# Patient Record
Sex: Male | Born: 1960
Health system: Southern US, Community
[De-identification: ages and names within clinical notes are randomized; demographics above are authoritative.]

## PROBLEM LIST (undated history)

## (undated) DIAGNOSIS — R040 Epistaxis: Secondary | ICD-10-CM

## (undated) DIAGNOSIS — R7303 Prediabetes: Secondary | ICD-10-CM

## (undated) DIAGNOSIS — L309 Dermatitis, unspecified: Secondary | ICD-10-CM

## (undated) DIAGNOSIS — I10 Essential (primary) hypertension: Secondary | ICD-10-CM

## (undated) HISTORY — DX: Essential (primary) hypertension: I10

## (undated) HISTORY — DX: Prediabetes: R73.03

## (undated) HISTORY — DX: Dermatitis, unspecified: L30.9

## (undated) HISTORY — DX: Epistaxis: R04.0

---

## 2016-09-28 ENCOUNTER — Encounter: Payer: Self-pay | Admitting: Primary Care

## 2016-09-28 ENCOUNTER — Ambulatory Visit (INDEPENDENT_AMBULATORY_CARE_PROVIDER_SITE_OTHER): Payer: Commercial Managed Care - PPO | Admitting: Primary Care

## 2016-09-28 VITALS — BP 122/82 | HR 69 | Temp 97.9°F | Ht 70.0 in | Wt 267.1 lb

## 2016-09-28 DIAGNOSIS — R21 Rash and other nonspecific skin eruption: Secondary | ICD-10-CM | POA: Diagnosis not present

## 2016-09-28 DIAGNOSIS — I10 Essential (primary) hypertension: Secondary | ICD-10-CM | POA: Insufficient documentation

## 2016-09-28 DIAGNOSIS — L309 Dermatitis, unspecified: Secondary | ICD-10-CM | POA: Diagnosis not present

## 2016-09-28 DIAGNOSIS — R040 Epistaxis: Secondary | ICD-10-CM

## 2016-09-28 DIAGNOSIS — L400 Psoriasis vulgaris: Secondary | ICD-10-CM | POA: Insufficient documentation

## 2016-09-28 DIAGNOSIS — M7051 Other bursitis of knee, right knee: Secondary | ICD-10-CM | POA: Diagnosis not present

## 2016-09-28 MED ORDER — CLOTRIMAZOLE-BETAMETHASONE 1-0.05 % EX CREA: 1.0000 "application " | TOPICAL_CREAM | Freq: Two times a day (BID) | CUTANEOUS | 3 refills | Status: DC

## 2016-09-28 MED ORDER — PREDNISONE 10 MG PO TABS: ORAL_TABLET | ORAL | 0 refills | Status: DC

## 2016-09-28 NOTE — Progress Notes (Signed)
Pre visit review using our clinic review tool, if applicable. No additional management support is needed unless otherwise documented below in the visit note. 

## 2016-09-28 NOTE — Patient Instructions (Signed)
You rash represents dyshidrotic eczema.  Continue your Lotrisone cream, apply it twice daily.  Start prednisone tablets. Take three tablets for 3 days, then two tablets for 3 days, then one tablet for 3 days.   You will be contacted regarding your referral to Dermatology.  Please let us know if you have not heard back within one week.   Wear a knee brace to reduce swelling. Continue to elevate your legs.  Please notify me if no improvement in knee swelling.  Please schedule a physical with me in 2018. You may also schedule a lab only appointment 3-4 days prior. We will discuss your lab results in detail during your physical.  It was a pleasure to meet you today! Please don't hesitate to call me with any questions. Welcome to [facility]!

## 2016-09-28 NOTE — Assessment & Plan Note (Signed)
Suspect due to changes in temperature, dry climate. Will have him try saline nasal spray, Vaseline to nares, humidifier. If no improvement with changes in season, consider ENT referral.

## 2016-09-28 NOTE — Assessment & Plan Note (Signed)
Managed on Norvasc and Hyzaar, stable today, continue current regimen. Check BMP at upcoming physical.

## 2016-09-28 NOTE — Assessment & Plan Note (Signed)
Located to bilateral palmar hands. Somewhat represents dyshidrotic eczema. Will treat with oral and topical steroids. Dermatology referral placed if no improvement.

## 2016-09-28 NOTE — Progress Notes (Signed)
Subjective:    Patient ID: Joseph Howe, male    DOB: May 14, 1961, 56 y.o.   MRN: [id number]  HPI  Joseph Howe is a 56 year old male who presents today to establish care and discuss the problems mentioned below. Will obtain old records. His last physical was 2 years ago.  1) Epistaxis: Occur in the morning, located to the left nares only. Occur every 2-3 days on average, sometimes several days in a row. His house is dry and is working on getting a humidifier. The bleeding will stop after 1-2 minutes after application of pressure, and bleeding is minimal. He does not take any antihistamines OTC. He's not tried anything OTC for nosebleeds. He does have a history of nose bleeds that occurred years ago secondary to a dry environment. Not managed on blood thinners.  2) Eczema: Managed on clotrimazole-betamethasone cream with improvement. Mostly to feet that has been present for the past 20 years.   He has noticed blistering to his hands that will dry out. This has been present for the past 1 month. Overall he's noticed temporary improvement with OTC cortisone-10 mg. His hands have been dry for the past 1 month. He doesn't work with his hands often. No one else in his household has these symptoms. He denies changes to detergents, soaps, medications, foods. He denies pain.  3) Essential Hypertension: Diagnosed 15 years ago. Currently managed on Amlodipine 10 mg, losartan-HCTZ 100/25 mg. His BP today is 122/82. He endorses a fair diet and does not regularly exercise. He denies chest pain, cough, dizziness.  4) Knee Inflammation: Located to the right knee for the past 2 months. He thinks he may have injured his knee while moving 2 months ago as he remembers bumping into objects. Improvement with Aleve. He once had bursitis with enough fluid accumulation that had to be aspirated. He denies pain.  Review of Systems  Constitutional: Negative for fever.  HENT: Positive for nosebleeds.   Eyes: Negative for  visual disturbance.  Respiratory: Negative for cough.   Cardiovascular: Negative for chest pain.  Musculoskeletal: Positive for joint swelling.  Skin: Positive for rash.  Allergic/Immunologic: Negative for environmental allergies.  Neurological: Negative for dizziness.  Hematological: Negative for adenopathy.       No past medical history on file.   Social History   Social History  . Marital status: Married    Spouse name: N/A  . Number of children: N/A  . Years of education: N/A   Occupational History  . Not on file.   Social History Main Topics  . Smoking status: Never Smoker  . Smokeless tobacco: Never Used  . Alcohol use Yes  . Drug use: Unknown  . Sexual activity: Not on file   Other Topics Concern  . Not on file   Social History Narrative  . No narrative on file    No past surgical history on file.  No family history on file.  No Known Allergies  No current outpatient prescriptions on file prior to visit.   No current facility-administered medications on file prior to visit.     BP 122/82   Pulse 69   Temp 97.9 F (36.6 C) (Oral)   Ht 5\' 10"  (1.778 m)   Wt 267 lb 1.9 oz (121.2 kg)   SpO2 97%   BMI 38.33 kg/m    Objective:   Physical Exam  Constitutional: He is oriented to person, place, and time. He appears well-nourished.  Neck: Neck supple.  Cardiovascular:  Normal rate and regular rhythm.   Pulmonary/Chest: Effort normal and breath sounds normal. He has no wheezes. He has no rales.  Musculoskeletal: Normal range of motion.  Very minimal amount of swelling to right anterior patella. Good ROM. No pain.  Neurological: He is alert and oriented to person, place, and time.  Skin: Skin is warm and dry. No erythema.  Dried blisters to bilateral palmar hands. Dry skin to hands.  Psychiatric: He has a normal mood and affect.          Assessment & Plan:

## 2016-09-28 NOTE — Assessment & Plan Note (Signed)
Chronic to feet. Refill provided for lotrisone cream.

## 2016-09-28 NOTE — Assessment & Plan Note (Signed)
Very mild.  Will trial oral steroids, hold NSAIDS. Discussed use of a knee brace, elevated legs at night. Weight loss. Follow up PRN.

## 2017-01-20 ENCOUNTER — Other Ambulatory Visit: Payer: Self-pay | Admitting: Primary Care

## 2017-01-20 DIAGNOSIS — I10 Essential (primary) hypertension: Secondary | ICD-10-CM

## 2017-01-20 DIAGNOSIS — Z125 Encounter for screening for malignant neoplasm of prostate: Secondary | ICD-10-CM

## 2017-01-23 ENCOUNTER — Other Ambulatory Visit (INDEPENDENT_AMBULATORY_CARE_PROVIDER_SITE_OTHER): Payer: Commercial Managed Care - PPO

## 2017-01-23 DIAGNOSIS — I10 Essential (primary) hypertension: Secondary | ICD-10-CM

## 2017-01-23 DIAGNOSIS — Z125 Encounter for screening for malignant neoplasm of prostate: Secondary | ICD-10-CM

## 2017-01-23 LAB — COMPREHENSIVE METABOLIC PANEL
ALT: 41 U/L (ref 0–53)
AST: 28 U/L (ref 0–37)
Albumin: 4.3 g/dL (ref 3.5–5.2)
Alkaline Phosphatase: 39 U/L (ref 39–117)
BILIRUBIN TOTAL: 0.5 mg/dL (ref 0.2–1.2)
BUN: 10 mg/dL (ref 6–23)
CO2: 30 meq/L (ref 19–32)
Calcium: 9.6 mg/dL (ref 8.4–10.5)
Chloride: 100 mEq/L (ref 96–112)
Creatinine, Ser: 0.63 mg/dL (ref 0.40–1.50)
GFR: 140.97 mL/min (ref >60.00)
GLUCOSE: 115 mg/dL — AB (ref 70–99)
Potassium: 4.2 mEq/L (ref 3.5–5.1)
SODIUM: 137 meq/L (ref 135–145)
Total Protein: 7 g/dL (ref 6.0–8.3)

## 2017-01-23 LAB — LIPID PANEL
CHOL/HDL RATIO: 3
Cholesterol: 180 mg/dL (ref 0–200)
HDL: 61.1 mg/dL (ref >39.00)
LDL CALC: 106 mg/dL — AB (ref 0–99)
NONHDL: 118.94
Triglycerides: 65 mg/dL (ref 0.0–149.0)
VLDL: 13 mg/dL (ref 0.0–40.0)

## 2017-01-23 LAB — PSA: PSA: 2.64 ng/mL (ref 0.10–4.00)

## 2017-01-27 ENCOUNTER — Encounter: Payer: Self-pay | Admitting: Primary Care

## 2017-01-27 ENCOUNTER — Ambulatory Visit (INDEPENDENT_AMBULATORY_CARE_PROVIDER_SITE_OTHER): Payer: Commercial Managed Care - PPO | Admitting: Primary Care

## 2017-01-27 ENCOUNTER — Other Ambulatory Visit: Payer: Self-pay | Admitting: Primary Care

## 2017-01-27 VITALS — BP 144/94 | HR 71 | Temp 98.2°F | Ht 70.0 in | Wt 256.8 lb

## 2017-01-27 DIAGNOSIS — I10 Essential (primary) hypertension: Secondary | ICD-10-CM | POA: Diagnosis not present

## 2017-01-27 DIAGNOSIS — R739 Hyperglycemia, unspecified: Secondary | ICD-10-CM | POA: Diagnosis not present

## 2017-01-27 DIAGNOSIS — R7303 Prediabetes: Secondary | ICD-10-CM | POA: Insufficient documentation

## 2017-01-27 DIAGNOSIS — L309 Dermatitis, unspecified: Secondary | ICD-10-CM | POA: Diagnosis not present

## 2017-01-27 DIAGNOSIS — E119 Type 2 diabetes mellitus without complications: Secondary | ICD-10-CM | POA: Insufficient documentation

## 2017-01-27 DIAGNOSIS — Z23 Encounter for immunization: Secondary | ICD-10-CM | POA: Diagnosis not present

## 2017-01-27 DIAGNOSIS — Z0000 Encounter for general adult medical examination without abnormal findings: Secondary | ICD-10-CM

## 2017-01-27 DIAGNOSIS — Z0001 Encounter for general adult medical examination with abnormal findings: Secondary | ICD-10-CM | POA: Insufficient documentation

## 2017-01-27 LAB — HEMOGLOBIN A1C: HEMOGLOBIN A1C: 6.1 % (ref 4.6–6.5)

## 2017-01-27 MED ORDER — CLOTRIMAZOLE-BETAMETHASONE 1-0.05 % EX CREA: 1.0000 "application " | TOPICAL_CREAM | Freq: Two times a day (BID) | CUTANEOUS | 3 refills | Status: DC

## 2017-01-27 MED ORDER — PREDNISONE 10 MG PO TABS: ORAL_TABLET | ORAL | 0 refills | Status: DC

## 2017-01-27 NOTE — Assessment & Plan Note (Signed)
Chronic to hands and feet. Refill provided for Lotrisone cream. Low-dose prednisone course sent to pharmacy for mild outbreak to right palm.

## 2017-01-27 NOTE — Addendum Note (Signed)
Addended by: Jacqualin Combes on: 01/27/2017 03:02 PM   Modules accepted: Orders

## 2017-01-27 NOTE — Assessment & Plan Note (Addendum)
TD due, provided today. PSA normal. Discussed the importance of a healthy diet and regular exercise in order for weight loss, and to reduce the risk of other medical problems. Exam today unremarkable, except for chronic eczema. Labs with hyperglycemia, A1c pending. Otherwise unremarkable. Follow-up in one year for annual exam.

## 2017-01-27 NOTE — Progress Notes (Signed)
Subjective:    Patient ID: Joseph Howe, male    DOB: Sep 15, 1960, 56 y.o.   MRN: [id number]  HPI  Mr. Joseph Howe is a 56 year old male who presents today for complete physical.  1) Eczema: Located to hands and feet for years. The prednisone course last visit helped to improve the rash to his right palm, but no resolve. He would like another course of this to hopefully eradicate the eczema.   The Lotrisone cream is helpful. He has followed with both podiatry and dermatology in the past. This is the best regimen for him.  Immunizations: -Tetanus: Completed over 10 years ago. -Influenza: Did not complete last season   Diet: He endorses a high protein, low carb diet that he began in February 2018. Breakfast: Atkins protein bar Lunch: Protein shake Dinner: Meats, salads Snacks: Nuts, meat/cheese Desserts: None, sometimes low carb ice cream Beverages: Water, coffee, three drinks four days weekly of Vodka.   Exercise: He does not currently exercise Eye exam: Completed three years ago. Dental exam: Completes annually Colonoscopy: Completed at age 19, due at age 33. Due in 2019. PSA: Normal today.  Wt Readings from Last 3 Encounters:  01/27/17 256 lb 12.8 oz (116.5 kg)  09/28/16 267 lb 1.9 oz (121.2 kg)      Review of Systems  Constitutional: Negative for unexpected weight change.  HENT: Negative for rhinorrhea.   Respiratory: Negative for cough and shortness of breath.   Cardiovascular: Negative for chest pain.  Gastrointestinal: Negative for constipation and diarrhea.  Genitourinary: Negative for difficulty urinating.  Musculoskeletal: Positive for arthralgias. Negative for myalgias.  Skin: Positive for rash.  Allergic/Immunologic: Negative for environmental allergies.  Neurological: Negative for dizziness, numbness and headaches.  Psychiatric/Behavioral:       Denies concerns for anxiety or depression       No past medical history on file.   Social History   Social  History  . Marital status: Married    Spouse name: N/A  . Number of children: N/A  . Years of education: N/A   Occupational History  . Not on file.   Social History Main Topics  . Smoking status: Never Smoker  . Smokeless tobacco: Never Used  . Alcohol use Yes  . Drug use: Unknown  . Sexual activity: Not on file   Other Topics Concern  . Not on file   Social History Narrative  . No narrative on file    No past surgical history on file.  No family history on file.  No Known Allergies  Current Outpatient Prescriptions on File Prior to Visit  Medication Sig Dispense Refill  . amLODipine (NORVASC) 10 MG tablet Take 10 mg by mouth daily.     Marland Kitchen losartan-hydrochlorothiazide (HYZAAR) 100-25 MG tablet Take 1 tablet by mouth daily.      No current facility-administered medications on file prior to visit.     BP (!) 144/94   Pulse 71   Temp 98.2 F (36.8 C) (Oral)   Ht 5\' 10"  (1.778 m)   Wt 256 lb 12.8 oz (116.5 kg)   SpO2 96%   BMI 36.85 kg/m    Objective:   Physical Exam  Constitutional: He is oriented to person, place, and time. He appears well-nourished.  HENT:  Right Ear: Tympanic membrane and ear canal normal.  Left Ear: Tympanic membrane and ear canal normal.  Nose: Nose normal. Right sinus exhibits no maxillary sinus tenderness and no frontal sinus tenderness. Left sinus exhibits no  maxillary sinus tenderness and no frontal sinus tenderness.  Mouth/Throat: Oropharynx is clear and moist.  Eyes: Conjunctivae and EOM are normal. Pupils are equal, round, and reactive to light.  Neck: Neck supple. Carotid bruit is not present. No thyromegaly present.  Cardiovascular: Normal rate, regular rhythm and normal heart sounds.   Pulmonary/Chest: Effort normal and breath sounds normal. He has no wheezes. He has no rales.  Abdominal: Soft. Bowel sounds are normal. There is no tenderness.  Musculoskeletal: Normal range of motion.  Neurological: He is alert and oriented to  person, place, and time. He has normal reflexes. No cranial nerve deficit.  Skin: Skin is warm and dry.  Dry, scaly rash to right palm and right dorsal MCP joint of second digit. Scaly, dry skin to bilateral plantar feet.  Psychiatric: He has a normal mood and affect.          Assessment & Plan:

## 2017-01-27 NOTE — Assessment & Plan Note (Signed)
Above goal today, normal reading during last visit several months ago. Will have him start monitoring at home and report elevated readings at or above 135/90. BMP unremarkable. Continue amlodipine and Hyzaar.

## 2017-01-27 NOTE — Assessment & Plan Note (Signed)
Noted on fasting labs. Check A1c today.

## 2017-01-27 NOTE — Patient Instructions (Addendum)
Complete lab work prior to leaving today. I will notify you of your results once received.   Start exercising. You should be getting 150 minutes of moderate intensity exercise weekly.  Increase consumption of vegetables, fruit, whole grains, water.  Ensure you are consuming 64 ounces of water daily.  You were provided with a tetanus vaccination which will cover you for 10 years.  Start prednisone tablets. Take three tablets for 3 days, then two tablets for 3 days, then one tablet for 3 days.  Monitor your blood pressure and notify me if you see readings at or above 135/90 on a consistent basis.  Follow up in 1 year for your annual exam or sooner if needed.  It was a pleasure to see you today!

## 2017-02-01 ENCOUNTER — Telehealth: Payer: Self-pay | Admitting: Primary Care

## 2017-02-01 NOTE — Telephone Encounter (Signed)
Pt returned call

## 2017-02-01 NOTE — Telephone Encounter (Signed)
Pt returned your call about labs  Thanks  [phone]

## 2017-02-01 NOTE — Telephone Encounter (Signed)
Message left for patient to return my call.  

## 2017-02-02 NOTE — Telephone Encounter (Signed)
Spoken to patient and lab appt has been scheduled.

## 2017-03-20 ENCOUNTER — Encounter: Payer: Self-pay | Admitting: Primary Care

## 2017-03-20 ENCOUNTER — Other Ambulatory Visit: Payer: Self-pay | Admitting: Primary Care

## 2017-03-20 DIAGNOSIS — L309 Dermatitis, unspecified: Secondary | ICD-10-CM

## 2017-03-20 MED ORDER — PREDNISONE 10 MG PO TABS: ORAL_TABLET | ORAL | 0 refills | Status: DC

## 2017-04-10 ENCOUNTER — Ambulatory Visit (INDEPENDENT_AMBULATORY_CARE_PROVIDER_SITE_OTHER): Payer: Commercial Managed Care - PPO | Admitting: Primary Care

## 2017-04-10 ENCOUNTER — Encounter: Payer: Self-pay | Admitting: Primary Care

## 2017-04-10 ENCOUNTER — Ambulatory Visit: Payer: Commercial Managed Care - PPO | Admitting: Primary Care

## 2017-04-10 VITALS — BP 134/84 | HR 68 | Temp 98.4°F | Ht 70.0 in | Wt 253.4 lb

## 2017-04-10 DIAGNOSIS — R21 Rash and other nonspecific skin eruption: Secondary | ICD-10-CM

## 2017-04-10 NOTE — Patient Instructions (Signed)
Stop by the front desk and speak with either Rosaria Ferries or Shirlean Mylar regarding your referral to Dermatology.  It was a pleasure to see you today!

## 2017-04-10 NOTE — Assessment & Plan Note (Signed)
Temporary improvement with topical, oral, and patch treatment. Referral placed to dermatology for further evaluation.

## 2017-04-10 NOTE — Progress Notes (Signed)
   Subjective:    Patient ID: Joseph Howe, male    DOB: 1961-07-18, 56 y.o.   MRN: [id number]  HPI  Joseph Howe is a 56 year old male with a history of eczema and rash to bilateral hands and feet who presents today for referral.  He's experienced flaking, peeling, and dry skin to the plantar feet for the past 20+ years. He once saw a dermatologist years ago who thought this was psoriasis. He's currently using clotrimazole-betamethasone cream and Eucaderm hydrocolloid patches which provide temporary improvement for 3 months. He denies itching, erythema, pain. He would like to see a dermatologist again for further evaluation.  Review of Systems  Constitutional: Negative for fever.  Skin: Positive for rash. Negative for wound.  Neurological: Negative for numbness.       No past medical history on file.   Social History   Social History  . Marital status: Married    Spouse name: N/A  . Number of children: N/A  . Years of education: N/A   Occupational History  . Not on file.   Social History Main Topics  . Smoking status: Never Smoker  . Smokeless tobacco: Never Used  . Alcohol use Yes  . Drug use: Unknown  . Sexual activity: Not on file   Other Topics Concern  . Not on file   Social History Narrative  . No narrative on file    No past surgical history on file.  No family history on file.  Allergies  Allergen Reactions  . Pollen Extract     Current Outpatient Prescriptions on File Prior to Visit  Medication Sig Dispense Refill  . amLODipine (NORVASC) 10 MG tablet Take 10 mg by mouth daily.     . clotrimazole-betamethasone (LOTRISONE) cream Apply 1 application topically 2 (two) times daily. 45 g 3  . losartan-hydrochlorothiazide (HYZAAR) 100-25 MG tablet Take 1 tablet by mouth daily.     . predniSONE (DELTASONE) 10 MG tablet Take three tablets for 3 days, then two tablets for 3 days, then one tablet for 3 days. 18 tablet 0   No current facility-administered  medications on file prior to visit.     BP 134/84   Pulse 68   Temp 98.4 F (36.9 C) (Oral)   Ht 5\' 10"  (1.778 m)   Wt 253 lb 6.4 oz (114.9 kg)   SpO2 97%   BMI 36.36 kg/m    Objective:   Physical Exam  Constitutional: He appears well-nourished.  Neck: Neck supple.  Cardiovascular: Normal rate.   Pulmonary/Chest: Effort normal.  Skin: Skin is warm and dry.  Peeling skin to bilateral plantar feet. No erythema, rash, cracks to skin.          Assessment & Plan:

## 2017-04-11 ENCOUNTER — Ambulatory Visit: Payer: Commercial Managed Care - PPO | Admitting: Primary Care

## 2017-05-12 ENCOUNTER — Other Ambulatory Visit: Payer: Self-pay | Admitting: Primary Care

## 2017-07-27 ENCOUNTER — Other Ambulatory Visit: Payer: Self-pay | Admitting: Primary Care

## 2017-08-04 ENCOUNTER — Other Ambulatory Visit: Payer: Commercial Managed Care - PPO

## 2017-08-17 ENCOUNTER — Encounter: Payer: Self-pay | Admitting: Primary Care

## 2017-08-21 ENCOUNTER — Other Ambulatory Visit: Payer: Commercial Managed Care - PPO

## 2017-08-21 ENCOUNTER — Other Ambulatory Visit (INDEPENDENT_AMBULATORY_CARE_PROVIDER_SITE_OTHER): Payer: Commercial Managed Care - PPO

## 2017-08-21 DIAGNOSIS — R7303 Prediabetes: Secondary | ICD-10-CM | POA: Diagnosis not present

## 2017-08-21 LAB — HEMOGLOBIN A1C: Hgb A1c MFr Bld: 6 % (ref 4.6–6.5)

## 2017-08-24 ENCOUNTER — Ambulatory Visit: Payer: Commercial Managed Care - PPO | Admitting: Primary Care

## 2017-08-24 ENCOUNTER — Encounter: Payer: Self-pay | Admitting: Primary Care

## 2017-08-24 VITALS — BP 134/78 | HR 78 | Temp 98.2°F | Wt 251.5 lb

## 2017-08-24 DIAGNOSIS — M25569 Pain in unspecified knee: Secondary | ICD-10-CM

## 2017-08-24 DIAGNOSIS — R7303 Prediabetes: Secondary | ICD-10-CM

## 2017-08-24 DIAGNOSIS — Z720 Tobacco use: Secondary | ICD-10-CM | POA: Diagnosis not present

## 2017-08-24 DIAGNOSIS — G8929 Other chronic pain: Secondary | ICD-10-CM | POA: Diagnosis not present

## 2017-08-24 DIAGNOSIS — M25562 Pain in left knee: Secondary | ICD-10-CM | POA: Diagnosis not present

## 2017-08-24 MED ORDER — NICOTINE 21 MG/24HR TD PT24: 21.0000 mg | MEDICATED_PATCH | Freq: Every day | TRANSDERMAL | 1 refills | Status: DC

## 2017-08-24 NOTE — Assessment & Plan Note (Signed)
Repeat A1C about the same. Strongly encouraged him to limit alcohol use and to start exercising. Will repeat this Summer during CPE.

## 2017-08-24 NOTE — Patient Instructions (Addendum)
Start the nicotine patch for smoking cessation.  Apply one patch onto a hairless area of the skin once daily, rotate skin sites daily. Use the 21 mcg patch for a total of 6 weeks.   Please update me in 5-6 weeks so we can titrate you down to a decreased dose of 14 mcg.   Start exercising. You should be getting 150 minutes of moderate intensity exercise weekly.  Limit alcohol. Increase vegetables, fruit, whole grains.  Ensure you are consuming 64 ounces of water daily.  You are welcome to see Dr. Lorelei Pont, our Sports Medicine doctor.  Please schedule a physical with me in June 2019. You may also schedule a lab only appointment 3-4 days prior. We will discuss your lab results in detail during your physical.  It was a pleasure to see you today!

## 2017-08-24 NOTE — Progress Notes (Signed)
Subjective:    Patient ID: Joseph Howe, male    DOB: 1961/06/05, 57 y.o.   MRN: [id number]  HPI  Joseph Howe is a 57 year old male with a history of tobacco abuse who presents today wanting to discuss cessation from tobacco products and for follow up.   1) Tobacco abuse: He started smoking cigarettes one year ago. He is currently smoking less than one pack per day. He's smoked cigars once every 1-3 months for the past 10 years. He coughs every morning which will clear during the day. He denies wheezing or shortness of breath, hemoptysis, unexplained weight loss. He's not tried anything over the counter for his symptoms.   2) Prediabetes: Recent A1C of 6.0 on labs. A1C of 6.0 in June 2018. He's surprised by these labs as he endorses a healthy diet.   Diet currently consists of:  Breakfast: Protein bar Lunch: Sandwich, steak, eggs, protein shake Dinner: Meat, some vegetables, starch Snacks: None Desserts: Low carb ice cream three times weekly Beverages: Water, coffee, vodka and tonic water (3 drinks per night, 5 days weekly).  Exercise: He does not currently exercise  3) Chronic Knee Pain: Located to left knee for the past 10 years. He's wearing a  compression brace and taking tumeric with some improvement. History of torn ACL in college. He was once told that he is "bone-on-bone" to his left knee.    Review of Systems  Constitutional: Negative for unexpected weight change.  Respiratory: Negative for shortness of breath.   Cardiovascular: Negative for chest pain.  Musculoskeletal:       Chronic left knee pain       No past medical history on file.   Social History   Socioeconomic History  . Marital status: Married    Spouse name: Not on file  . Number of children: Not on file  . Years of education: Not on file  . Highest education level: Not on file  Social Needs  . Financial resource strain: Not on file  . Food insecurity - worry: Not on file  . Food insecurity -  inability: Not on file  . Transportation needs - medical: Not on file  . Transportation needs - non-medical: Not on file  Occupational History  . Not on file  Tobacco Use  . Smoking status: Current Some Day Smoker  . Smokeless tobacco: Never Used  Substance and Sexual Activity  . Alcohol use: Yes  . Drug use: Not on file  . Sexual activity: Not on file  Other Topics Concern  . Not on file  Social History Narrative  . Not on file    No family history on file.  Allergies  Allergen Reactions  . Pollen Extract     Current Outpatient Medications on File Prior to Visit  Medication Sig Dispense Refill  . amLODipine (NORVASC) 10 MG tablet TAKE 1 TABLET BY MOUTH EVERY DAY 90 tablet 1  . clotrimazole-betamethasone (LOTRISONE) cream Apply 1 application topically 2 (two) times daily. 45 g 3  . losartan-hydrochlorothiazide (HYZAAR) 100-25 MG tablet TAKE 1 TABLET BY MOUTH EVERY DAY 90 tablet 1   No current facility-administered medications on file prior to visit.     BP 134/78   Pulse 78   Temp 98.2 F (36.8 C) (Oral)   Wt 251 lb 8 oz (114.1 kg)   SpO2 97%   BMI 36.09 kg/m    Objective:   Physical Exam  Constitutional: He appears well-nourished.  Neck: Neck supple.  Cardiovascular: Normal rate and regular rhythm.  Pulmonary/Chest: Effort normal and breath sounds normal.  Skin: Skin is warm and dry.          Assessment & Plan:

## 2017-08-24 NOTE — Assessment & Plan Note (Signed)
Continue wearing brace.  Discussed different options, he will check in to seeing our sports medicine team for injections and/or other treatment.

## 2017-08-24 NOTE — Assessment & Plan Note (Signed)
Ready to quit. Discussed various options and recommended he start with patches. Rx for nicotine 21 mcg patches sent to pharmacy. Will have him start at 21 mcg x 6 weeks then titrate down. He will update if he encounters any problems.

## 2017-08-28 ENCOUNTER — Other Ambulatory Visit: Payer: Commercial Managed Care - PPO

## 2017-08-31 ENCOUNTER — Encounter: Payer: Self-pay | Admitting: Family Medicine

## 2017-08-31 ENCOUNTER — Ambulatory Visit (INDEPENDENT_AMBULATORY_CARE_PROVIDER_SITE_OTHER)
Admission: RE | Admit: 2017-08-31 | Discharge: 2017-08-31 | Disposition: A | Discharge status: Home / Self Care | Payer: Commercial Managed Care - PPO | Source: Ambulatory Visit | Attending: Family Medicine | Admitting: Family Medicine

## 2017-08-31 ENCOUNTER — Ambulatory Visit (INDEPENDENT_AMBULATORY_CARE_PROVIDER_SITE_OTHER): Payer: Commercial Managed Care - PPO | Admitting: Family Medicine

## 2017-08-31 ENCOUNTER — Other Ambulatory Visit: Payer: Self-pay

## 2017-08-31 VITALS — BP 128/80 | HR 74 | Temp 98.6°F | Ht 70.0 in | Wt 251.0 lb

## 2017-08-31 DIAGNOSIS — M1712 Unilateral primary osteoarthritis, left knee: Secondary | ICD-10-CM

## 2017-08-31 DIAGNOSIS — G8929 Other chronic pain: Secondary | ICD-10-CM

## 2017-08-31 DIAGNOSIS — M25562 Pain in left knee: Secondary | ICD-10-CM | POA: Diagnosis not present

## 2017-08-31 NOTE — Progress Notes (Signed)
Dr. Frederico Hamman T. Earlie Arciga, MD, [facility] [place], [address] Phone: [phone] Fax: [phone]  08/31/2017  Patient: Joseph Howe, MRN: [id number], DOB: 1960/08/17, 57 y.o.  Primary Physician:  Pleas Koch, NP   Chief Complaint  Patient presents with  . Knee Pain    Left   Subjective:   Myka Lukins is a 57 y.o. very pleasant male patient who presents with the following:  57 yo had a partial ACL tear and then when turned 42 had a lot of cartilage. Now has some more pain and decrease of rom with stairs. Has lost about 30 pounds.   The patient has been having problems with his knee for approximately 30 years.  Is unclear if he had a partial or a full tear of his ACL, but he does describe some mechanical instability and a pivot shift phenomenon that is been happening for decades when he is moving.  He does currently have a moderate effusion, but he has not had any recent trauma or injury to his knee.  He has not had any prior operative intervention to the knee.  Currently, he is not really having any significant pain, and his primary concern is range of motion and functionality long-term.  Past Medical History, Surgical History, Social History, Family History, Problem List, Medications, and Allergies have been reviewed and updated if relevant.  Patient Active Problem List   Diagnosis Date Noted  . Tobacco abuse 08/24/2017  . Chronic knee pain 08/24/2017  . Preventative health care 01/27/2017  . Prediabetes 01/27/2017  . Rash and nonspecific skin eruption 09/28/2016  . Eczema 09/28/2016  . Bursitis of right knee 09/28/2016  . Essential hypertension 09/28/2016  . Epistaxis 09/28/2016    History reviewed. No pertinent past medical history.  History reviewed. No pertinent surgical history.  Social History   Socioeconomic History  . Marital status: Married    Spouse name: Not on file  . Number of children:  Not on file  . Years of education: Not on file  . Highest education level: Not on file  Social Needs  . Financial resource strain: Not on file  . Food insecurity - worry: Not on file  . Food insecurity - inability: Not on file  . Transportation needs - medical: Not on file  . Transportation needs - non-medical: Not on file  Occupational History  . Not on file  Tobacco Use  . Smoking status: Current Some Day Smoker  . Smokeless tobacco: Never Used  Substance and Sexual Activity  . Alcohol use: Yes  . Drug use: Not on file  . Sexual activity: Not on file  Other Topics Concern  . Not on file  Social History Narrative  . Not on file    History reviewed. No pertinent family history.  Allergies  Allergen Reactions  . Pollen Extract     Medication list reviewed and updated in full in [facility].  GEN: No fevers, chills. Nontoxic. Primarily MSK c/o today. MSK: Detailed in the HPI GI: tolerating PO intake without difficulty Neuro: No numbness, parasthesias, or tingling associated. Otherwise the pertinent positives of the ROS are noted above.   Objective:   BP 128/80   Pulse 74   Temp 98.6 F (37 C) (Oral)   Ht 5\' 10"  (1.778 m)   Wt 251 lb (113.9 kg)   BMI 36.01 kg/m    GEN: WDWN, NAD, Non-toxic, Alert & Oriented x 3  HEENT: Atraumatic, Normocephalic.  Ears and Nose: No external deformity. EXTR: No clubbing/cyanosis/edema NEURO: Normal gait.  PSYCH: Normally interactive. Conversant. Not depressed or anxious appearing.  Calm demeanor.    Left knee: Lacks 2 degrees of extension.  There is a moderate effusion.  Flexion to 95 degrees.  He has tenderness on the medial and lateral joint lines.  No pain with loading the patellar facets.  Significant crepitus.  Stable MCL and LCL.  Anterior drawer appears to give greater give than anticipated.  Posterior drawer seems normal.  Significant guarding on L[place], but increased give compared to the contralateral side. mcmurrays  is grossly negative. Significant L quads wasting.  Radiology: Dg Knee 4 Views W/patella Left  Result Date: 08/31/2017 CLINICAL DATA:  Left knee discomfort EXAM: LEFT KNEE - COMPLETE 4+ VIEW COMPARISON:  None. FINDINGS: There is tricompartmental degenerative joint disease of the left knee. There is primary involvement of the medial compartment where there is almost complete loss of joint space with sclerosis and spurring present. No acute fracture is seen. There may be a small amount of knee joint fluid present. IMPRESSION: 1. Age advanced tricompartmental degenerative joint disease the left knee primarily involving the medial compartment. 2. Question small amount of left knee joint fluid. Electronically Signed   By: Ivar Drape M.D.   On: 08/31/2017 17:19     Assessment and Plan:   Chronic pain of left knee - Plan: DG Knee 4 Views W/Patella Left  Osteoarthrosis, localized, primary, knee, left  >25 minutes spent in face to face time with patient, >50% spent in counselling or coordination of care   My suspicion is that the patient has had an ACL deficient knee for decades.  His left knee is end-stage degenerative joint disease pathology, most notably medially, but his right knee looks remarkably well preserved.  Challenging case.  I doubt anything short of total joint arthroplasty would help the patient surgically.  He is going to work on some knee flexion exercise, hopefully to regain some range of motion, and begin cycling for exercise, and I encouraged him to work on his quadriceps strength.  Continue with tumeric, supplements, as well as Tylenol and NSAIDs as needed for intermittent pain.  Follow-up: if needed  Orders Placed This Encounter  Procedures  . DG Knee 4 Views W/Patella Left    Signed,  Gamaliel Charney T. Jory Tanguma, MD   Allergies as of 08/31/2017      Reactions   Pollen Extract       Medication List        Accurate as of 08/31/17 11:59 PM. Always use your most recent med  list.          amLODipine 10 MG tablet Commonly known as:  NORVASC TAKE 1 TABLET BY MOUTH EVERY DAY   clotrimazole-betamethasone cream Commonly known as:  LOTRISONE Apply 1 application topically 2 (two) times daily.   losartan-hydrochlorothiazide 100-25 MG tablet Commonly known as:  HYZAAR TAKE 1 TABLET BY MOUTH EVERY DAY   nicotine 21 mg/24hr patch Commonly known as:  NICODERM CQ - dosed in mg/24 hours Place 1 patch (21 mg total) onto the skin daily.

## 2017-11-08 ENCOUNTER — Other Ambulatory Visit: Payer: Self-pay | Admitting: Primary Care

## 2018-01-09 ENCOUNTER — Encounter: Payer: Self-pay | Admitting: Primary Care

## 2018-01-09 DIAGNOSIS — R7303 Prediabetes: Secondary | ICD-10-CM

## 2018-01-09 DIAGNOSIS — I10 Essential (primary) hypertension: Secondary | ICD-10-CM

## 2018-01-09 DIAGNOSIS — Z1159 Encounter for screening for other viral diseases: Secondary | ICD-10-CM

## 2018-01-18 ENCOUNTER — Other Ambulatory Visit: Payer: Self-pay | Admitting: Primary Care

## 2018-01-30 ENCOUNTER — Other Ambulatory Visit: Payer: Self-pay | Admitting: Primary Care

## 2018-01-30 ENCOUNTER — Other Ambulatory Visit (INDEPENDENT_AMBULATORY_CARE_PROVIDER_SITE_OTHER): Payer: Commercial Managed Care - PPO

## 2018-01-30 DIAGNOSIS — R7303 Prediabetes: Secondary | ICD-10-CM | POA: Diagnosis not present

## 2018-01-30 DIAGNOSIS — I10 Essential (primary) hypertension: Secondary | ICD-10-CM

## 2018-01-30 DIAGNOSIS — Z1159 Encounter for screening for other viral diseases: Secondary | ICD-10-CM

## 2018-01-30 LAB — LIPID PANEL
Cholesterol: 198 mg/dL (ref 0–200)
HDL: 53.2 mg/dL (ref >39.00)
LDL CALC: 123 mg/dL — AB (ref 0–99)
NONHDL: 144.71
Total CHOL/HDL Ratio: 4
Triglycerides: 111 mg/dL (ref 0.0–149.0)
VLDL: 22.2 mg/dL (ref 0.0–40.0)

## 2018-01-30 LAB — COMPREHENSIVE METABOLIC PANEL
ALBUMIN: 4.3 g/dL (ref 3.5–5.2)
ALK PHOS: 30 U/L — AB (ref 39–117)
ALT: 24 U/L (ref 0–53)
AST: 18 U/L (ref 0–37)
BUN: 12 mg/dL (ref 6–23)
CHLORIDE: 102 meq/L (ref 96–112)
CO2: 30 mEq/L (ref 19–32)
Calcium: 9.1 mg/dL (ref 8.4–10.5)
Creatinine, Ser: 0.68 mg/dL (ref 0.40–1.50)
GFR: 128.59 mL/min (ref >60.00)
GLUCOSE: 103 mg/dL — AB (ref 70–99)
POTASSIUM: 4 meq/L (ref 3.5–5.1)
SODIUM: 138 meq/L (ref 135–145)
TOTAL PROTEIN: 7.1 g/dL (ref 6.0–8.3)
Total Bilirubin: 0.5 mg/dL (ref 0.2–1.2)

## 2018-01-30 LAB — HEMOGLOBIN A1C: Hgb A1c MFr Bld: 6.1 % (ref 4.6–6.5)

## 2018-01-30 MED ORDER — LOSARTAN POTASSIUM-HCTZ 100-25 MG PO TABS: 1.0000 | ORAL_TABLET | Freq: Every day | ORAL | 0 refills | Status: DC

## 2018-01-30 NOTE — Telephone Encounter (Signed)
Pt notified losartan HCTZ 100-25 mg sent to [facility]. Pt to keep CPX app on 02/05/18. pts ins requires 90 day supply. Pt voiced understanding.

## 2018-01-31 LAB — HEPATITIS C ANTIBODY
Hepatitis C Ab: NONREACTIVE
SIGNAL TO CUT-OFF: 0.01 (ref <1.00)

## 2018-02-05 ENCOUNTER — Ambulatory Visit (INDEPENDENT_AMBULATORY_CARE_PROVIDER_SITE_OTHER): Payer: Commercial Managed Care - PPO | Admitting: Primary Care

## 2018-02-05 ENCOUNTER — Encounter: Payer: Self-pay | Admitting: Primary Care

## 2018-02-05 VITALS — BP 130/80 | HR 70 | Temp 98.1°F | Ht 70.0 in | Wt 258.0 lb

## 2018-02-05 DIAGNOSIS — G8929 Other chronic pain: Secondary | ICD-10-CM

## 2018-02-05 DIAGNOSIS — M7051 Other bursitis of knee, right knee: Secondary | ICD-10-CM

## 2018-02-05 DIAGNOSIS — L309 Dermatitis, unspecified: Secondary | ICD-10-CM | POA: Diagnosis not present

## 2018-02-05 DIAGNOSIS — I10 Essential (primary) hypertension: Secondary | ICD-10-CM

## 2018-02-05 DIAGNOSIS — Z0000 Encounter for general adult medical examination without abnormal findings: Secondary | ICD-10-CM

## 2018-02-05 DIAGNOSIS — R7303 Prediabetes: Secondary | ICD-10-CM | POA: Diagnosis not present

## 2018-02-05 DIAGNOSIS — M25562 Pain in left knee: Secondary | ICD-10-CM | POA: Diagnosis not present

## 2018-02-05 NOTE — Assessment & Plan Note (Signed)
Stable in the office today, continue current regimen. BMP unremarkable.  

## 2018-02-05 NOTE — Progress Notes (Signed)
Subjective:    Patient ID: Joseph Howe, male    DOB: February 23, 1961, 57 y.o.   MRN: [id number]  HPI  Mr. Nay is a 57 year old male who presents today for complete physical.  Immunizations: -Tetanus: Completed in 2018 -Influenza: Completed last season   BP Readings from Last 3 Encounters:  02/05/18 130/80  08/31/17 128/80  08/24/17 134/78      Diet: He endorses a healthy diet. Breakfast: Nuts, protein bar Lunch: Sandwich, protein drink, nuts Dinner: Vegetables, protein Snacks: Occasionally protein bar, nuts Desserts: Occasionally  Beverages: Coffee, water, Vodka with diet tonic  Exercise: He is not exercising regularly  Eye exam: Completed 4 years ago Dental exam: Completes every 6 months Colonoscopy: Completed in mid 40's, due. Opts for Cologuard PSA: Completed in 2018, negative Hep C Screen: Completed in 2019     Review of Systems  Constitutional: Negative for unexpected weight change.  HENT: Negative for rhinorrhea.   Respiratory: Negative for cough and shortness of breath.   Cardiovascular: Negative for chest pain.  Gastrointestinal: Negative for constipation and diarrhea.  Genitourinary: Negative for difficulty urinating.  Musculoskeletal: Negative for arthralgias and myalgias.  Skin: Negative for rash.  Allergic/Immunologic: Negative for environmental allergies.  Neurological: Negative for dizziness, numbness and headaches.  Psychiatric/Behavioral: The patient is not nervous/anxious.        No past medical history on file.   Social History   Socioeconomic History  . Marital status: Married    Spouse name: Not on file  . Number of children: Not on file  . Years of education: Not on file  . Highest education level: Not on file  Occupational History  . Not on file  Social Needs  . Financial resource strain: Not on file  . Food insecurity:    Worry: Not on file    Inability: Not on file  . Transportation needs:    Medical: Not on file   Non-medical: Not on file  Tobacco Use  . Smoking status: Current Some Day Smoker  . Smokeless tobacco: Never Used  Substance and Sexual Activity  . Alcohol use: Yes  . Drug use: Not on file  . Sexual activity: Not on file  Lifestyle  . Physical activity:    Days per week: Not on file    Minutes per session: Not on file  . Stress: Not on file  Relationships  . Social connections:    Talks on phone: Not on file    Gets together: Not on file    Attends religious service: Not on file    Active member of club or organization: Not on file    Attends meetings of clubs or organizations: Not on file    Relationship status: Not on file  . Intimate partner violence:    Fear of current or ex partner: Not on file    Emotionally abused: Not on file    Physically abused: Not on file    Forced sexual activity: Not on file  Other Topics Concern  . Not on file  Social History Narrative  . Not on file    No past surgical history on file.  No family history on file.  Allergies  Allergen Reactions  . Pollen Extract     Current Outpatient Medications on File Prior to Visit  Medication Sig Dispense Refill  . amLODipine (NORVASC) 10 MG tablet TAKE 1 TABLET BY MOUTH EVERY DAY 90 tablet 1  . clotrimazole-betamethasone (LOTRISONE) cream Apply 1 application topically 2 (  two) times daily. 45 g 3  . losartan-hydrochlorothiazide (HYZAAR) 100-25 MG tablet Take 1 tablet by mouth daily. 90 tablet 0  . nicotine (NICODERM CQ - DOSED IN MG/24 HOURS) 21 mg/24hr patch Place 1 patch (21 mg total) onto the skin daily. (Patient not taking: Reported on 02/05/2018) 28 patch 1   No current facility-administered medications on file prior to visit.     BP 130/80   Pulse 70   Temp 98.1 F (36.7 C) (Oral)   Ht 5\' 10"  (1.778 m)   Wt 258 lb (117 kg)   SpO2 97%   BMI 37.02 kg/m    Objective:   Physical Exam  Constitutional: He is oriented to person, place, and time. He appears well-nourished.  HENT:    Mouth/Throat: No oropharyngeal exudate.  Eyes: Pupils are equal, round, and reactive to light. EOM are normal.  Neck: Neck supple. No thyromegaly present.  Cardiovascular: Normal rate and regular rhythm.  Respiratory: Effort normal and breath sounds normal.  GI: Soft. Bowel sounds are normal. There is no tenderness.  Musculoskeletal: Normal range of motion.  Neurological: He is alert and oriented to person, place, and time.  Skin: Skin is warm and dry.  Psychiatric: He has a normal mood and affect.           Assessment & Plan:

## 2018-02-05 NOTE — Assessment & Plan Note (Signed)
Overall doing well. Using knee brace for support.

## 2018-02-05 NOTE — Patient Instructions (Addendum)
Start exercising. You should be getting 150 minutes of moderate intensity exercise weekly.  Continue to work on [employer] as discussed.  Ensure you are consuming 64 ounces of water daily.  Complete the Cologuard specimen as directed.  Follow up in 1 year for your annual exam or sooner if needed.  It was a pleasure to see you today!   Preventive Care 40-64 Years, Male Preventive care refers to lifestyle choices and visits with your health care provider that can promote health and wellness. What does preventive care include?  A yearly physical exam. This is also called an annual well check.  Dental exams once or twice a year.  Routine eye exams. Ask your health care provider how often you should have your eyes checked.  Personal lifestyle choices, including: ? Daily care of your teeth and gums. ? Regular physical activity. ? Eating a healthy diet. ? Avoiding tobacco and drug use. ? Limiting alcohol use. ? Practicing safe sex. ? Taking low-dose aspirin every day starting at age 64. What happens during an annual well check? The services and screenings done by your health care provider during your annual well check will depend on your age, overall health, lifestyle risk factors, and family history of disease. Counseling Your health care provider may ask you questions about your:  Alcohol use.  Tobacco use.  Drug use.  Emotional well-being.  Home and relationship well-being.  Sexual activity.  Eating habits.  Work and work Statistician.  Screening You may have the following tests or measurements:  Height, weight, and BMI.  Blood pressure.  Lipid and cholesterol levels. These may be checked every 5 years, or more frequently if you are over 83 years old.  Skin check.  Lung cancer screening. You may have this screening every year starting at age 77 if you have a 30-pack-year history of smoking and currently smoke or have quit within the past 15 years.  Fecal  occult blood test (FOBT) of the stool. You may have this test every year starting at age 76.  Flexible sigmoidoscopy or colonoscopy. You may have a sigmoidoscopy every 5 years or a colonoscopy every 10 years starting at age 45.  Prostate cancer screening. Recommendations will vary depending on your family history and other risks.  Hepatitis C blood test.  Hepatitis B blood test.  Sexually transmitted disease (STD) testing.  Diabetes screening. This is done by checking your blood sugar (glucose) after you have not eaten for a while (fasting). You may have this done every 1-3 years.  Discuss your test results, treatment options, and if necessary, the need for more tests with your health care provider. Vaccines Your health care provider may recommend certain vaccines, such as:  Influenza vaccine. This is recommended every year.  Tetanus, diphtheria, and acellular pertussis (Tdap, Td) vaccine. You may need a Td booster every 10 years.  Varicella vaccine. You may need this if you have not been vaccinated.  Zoster vaccine. You may need this after age 13.  Measles, mumps, and rubella (MMR) vaccine. You may need at least one dose of MMR if you were born in 1957 or later. You may also need a second dose.  Pneumococcal 13-valent conjugate (PCV13) vaccine. You may need this if you have certain conditions and have not been vaccinated.  Pneumococcal polysaccharide (PPSV23) vaccine. You may need one or two doses if you smoke cigarettes or if you have certain conditions.  Meningococcal vaccine. You may need this if you have certain conditions.  conditions.  Hepatitis A vaccine. You may need this if you have certain conditions or if you travel or work in places where you may be exposed to hepatitis A.  Hepatitis B vaccine. You may need this if you have certain conditions or if you travel or work in places where you may be exposed to hepatitis B.  Haemophilus influenzae type b (Hib) vaccine. You may need  this if you have certain risk factors.  Talk to your health care provider about which screenings and vaccines you need and how often you need them. This information is not intended to replace advice given to you by your health care provider. Make sure you discuss any questions you have with your health care provider. Document Released: 08/28/2015 Document Revised: 04/20/2016 Document Reviewed: 06/02/2015 Elsevier Interactive Patient Education  2018 Elsevier Inc. .      

## 2018-02-05 NOTE — Assessment & Plan Note (Signed)
Significantly improved with use of Tumeric, continue same.

## 2018-02-05 NOTE — Assessment & Plan Note (Signed)
Repeat A1C same at 6.1, continue to monitor.  Discussed the importance of a healthy diet and regular exercise in order for weight loss, and to reduce the risk of any potential medical problems.

## 2018-02-05 NOTE — Assessment & Plan Note (Signed)
Immunizations UTD. Colonoscopy due, opts for Cologuard. PSA UTD. Discussed the importance of a healthy diet and regular exercise in order for weight loss, and to reduce the risk of any potential medical problems. Exam unremarkable. Labs stable. Follow up in 1 year for CPE.

## 2018-02-05 NOTE — Assessment & Plan Note (Deleted)
Overall stable, using knee brace.

## 2018-03-05 ENCOUNTER — Encounter: Payer: Self-pay | Admitting: Primary Care

## 2018-03-06 ENCOUNTER — Encounter: Payer: Self-pay | Admitting: Primary Care

## 2018-03-06 ENCOUNTER — Ambulatory Visit: Payer: Commercial Managed Care - PPO | Admitting: Primary Care

## 2018-03-06 VITALS — BP 136/84 | HR 66 | Temp 98.1°F | Ht 70.0 in | Wt 257.2 lb

## 2018-03-06 DIAGNOSIS — L918 Other hypertrophic disorders of the skin: Secondary | ICD-10-CM | POA: Diagnosis not present

## 2018-03-06 NOTE — Patient Instructions (Signed)
Monitor the sites, keep them clean with soap and water. Cover them with band-aids for protection.  Please notify me if you develop redness, drainage, pain.  It was a pleasure to see you today!

## 2018-03-06 NOTE — Progress Notes (Signed)
Subjective:    Patient ID: Joseph Howe, male    DOB: Jan 19, 1961, 57 y.o.   MRN: [id number]  HPI  Joseph Howe is a 57 year old male who presents today for skin tag removal. He has 3 skin tags to his right inner thigh and 1 flesh colored skin tag to his posterior neck and right lateral abdomen. These skin tags are irritating at times and will catch in clothing.   Review of Systems  Constitutional: Negative for fever.  Skin: Negative for color change.       Skin tags, nevi       No past medical history on file.   Social History   Socioeconomic History  . Marital status: Married    Spouse name: Not on file  . Number of children: Not on file  . Years of education: Not on file  . Highest education level: Not on file  Occupational History  . Not on file  Social Needs  . Financial resource strain: Not on file  . Food insecurity:    Worry: Not on file    Inability: Not on file  . Transportation needs:    Medical: Not on file    Non-medical: Not on file  Tobacco Use  . Smoking status: Current Some Day Smoker  . Smokeless tobacco: Never Used  Substance and Sexual Activity  . Alcohol use: Yes  . Drug use: Not on file  . Sexual activity: Not on file  Lifestyle  . Physical activity:    Days per week: Not on file    Minutes per session: Not on file  . Stress: Not on file  Relationships  . Social connections:    Talks on phone: Not on file    Gets together: Not on file    Attends religious service: Not on file    Active member of club or organization: Not on file    Attends meetings of clubs or organizations: Not on file    Relationship status: Not on file  . Intimate partner violence:    Fear of current or ex partner: Not on file    Emotionally abused: Not on file    Physically abused: Not on file    Forced sexual activity: Not on file  Other Topics Concern  . Not on file  Social History Narrative  . Not on file    No past surgical history on file.  No family  history on file.  Allergies  Allergen Reactions  . Pollen Extract     Current Outpatient Medications on File Prior to Visit  Medication Sig Dispense Refill  . amLODipine (NORVASC) 10 MG tablet TAKE 1 TABLET BY MOUTH EVERY DAY 90 tablet 1  . clotrimazole-betamethasone (LOTRISONE) cream Apply 1 application topically 2 (two) times daily. 45 g 3  . losartan-hydrochlorothiazide (HYZAAR) 100-25 MG tablet Take 1 tablet by mouth daily. 90 tablet 0  . nicotine (NICODERM CQ - DOSED IN MG/24 HOURS) 21 mg/24hr patch Place 1 patch (21 mg total) onto the skin daily. (Patient not taking: Reported on 02/05/2018) 28 patch 1   No current facility-administered medications on file prior to visit.     BP 136/84   Pulse 66   Temp 98.1 F (36.7 C) (Oral)   Ht 5\' 10"  (1.778 m)   Wt 257 lb 4 oz (116.7 kg)   SpO2 97%   BMI 36.91 kg/m    Objective:   Physical Exam  Neck:    Cardiovascular: Normal rate.  Respiratory: Effort normal.  GI:    Musculoskeletal:       Legs: Skin: Skin is warm and dry.           Assessment & Plan:  Skin Tag/Nevi Removal:  Consent obtained. Sites prepped and cleansed with betadine. Pain ease used for analgesia. Three skin tags easily removed to right upper inner thigh with 11 blade. Skin tags to right lateral abdomen and posterior neck more difficult to remove, removed most entirely with dermablade.  All sites cleansed again with betadine. Silver nitrate used for bleeding. Dressings applied.  Discussed home care instructions. Patient tolerated well.   Pleas Koch, NP

## 2018-03-08 ENCOUNTER — Ambulatory Visit: Payer: Commercial Managed Care - PPO | Admitting: Primary Care

## 2018-04-19 ENCOUNTER — Other Ambulatory Visit: Payer: Self-pay | Admitting: Primary Care

## 2018-05-23 LAB — COLOGUARD

## 2018-07-05 MED ORDER — LOSARTAN POTASSIUM-HCTZ 100-25 MG PO TABS
1.0000 | ORAL_TABLET | Freq: Every day | ORAL | 1 refills | Status: DC
Start: 2018-07-05 — End: 2018-07-11

## 2018-07-11 ENCOUNTER — Other Ambulatory Visit: Payer: Self-pay | Admitting: Primary Care

## 2018-07-11 DIAGNOSIS — I10 Essential (primary) hypertension: Secondary | ICD-10-CM

## 2018-07-11 MED ORDER — HYDROCHLOROTHIAZIDE 25 MG PO TABS: 25.0000 mg | ORAL_TABLET | Freq: Every day | ORAL | 0 refills | Status: DC

## 2018-07-11 NOTE — Telephone Encounter (Signed)
Ok to change

## 2018-07-11 NOTE — Telephone Encounter (Signed)
Noted.  Separate prescription for losartan and hydrochlorothiazide sent to pharmacy.  My chart message sent to patient with notification of the change.

## 2018-08-17 NOTE — Telephone Encounter (Signed)
Raquel Sarna, will you schedule him for next week? It looks like I have openings.

## 2018-08-20 ENCOUNTER — Encounter: Payer: Self-pay | Admitting: *Deleted

## 2018-08-21 ENCOUNTER — Encounter: Payer: Self-pay | Admitting: Primary Care

## 2018-08-21 ENCOUNTER — Ambulatory Visit: Payer: Commercial Managed Care - PPO | Admitting: Primary Care

## 2018-08-21 DIAGNOSIS — R21 Rash and other nonspecific skin eruption: Secondary | ICD-10-CM

## 2018-08-21 MED ORDER — PREDNISONE 20 MG PO TABS: ORAL_TABLET | ORAL | 0 refills | Status: DC

## 2018-08-21 NOTE — Patient Instructions (Signed)
Start prednisone. Take 3 tablets for 3 days, then 2 tablets for 3 days, then 1 tablet for 3 days.  Please schedule an appointment with your dermatologist for follow up.  It was a pleasure to see you today!

## 2018-08-21 NOTE — Assessment & Plan Note (Signed)
Odd presentation but appears to be moderate to severe flare of psoriasis/eczema.  Treat with oral prednisone taper.  He will contact his dermatologist for a follow up visit. He will update if symptoms do not improve.  There is no respiratory involvement, he is stable for outpatient treatment.

## 2018-08-21 NOTE — Progress Notes (Signed)
Subjective:    Patient ID: Joseph Howe, male    DOB: [birth year], 58 y.o.   MRN: [id number]  HPI  Joseph Howe is a 58 year old male with a history of eczema and other rashes who presents today with a chief complaint of rash.  His rash is located to the entire body which began in mid November  to the left plantar foot. He has a history of episodic psoriasis/eczema and his rash typically begins to the left plantar foot. His rash moved to the left dorsal foot, then bilateral MCP joints of first digits, then to his bilateral elbows, then to his scalp and now face.   He's been taking oral Tumeric for the last year and this has kept him from experiencing flares. He's been using OTC moisturizers, his clotrimazole-betamethasone cream, and then an OTC transdermal patch to prevent skin peeling. None of these interventions have helped. His wife switched laundry detergents just prior to his rash, she has since switched back to their original.  His rash is itchy somewhat. He denies pain, scratchy throat, shortness of breath. He's seen dermatology in the past, last visit one year ago. He was diagnosed with "episodic psoriasis/eczema".   Review of Systems  Constitutional: Negative for fever.  HENT: Negative for postnasal drip and trouble swallowing.   Respiratory: Negative for shortness of breath and wheezing.   Skin: Positive for rash.       No past medical history on file.   Social History   Socioeconomic History  . Marital status: Married    Spouse name: Not on file  . Number of children: Not on file  . Years of education: Not on file  . Highest education level: Not on file  Occupational History  . Not on file  Social Needs  . Financial resource strain: Not on file  . Food insecurity:    Worry: Not on file    Inability: Not on file  . Transportation needs:    Medical: Not on file    Non-medical: Not on file  Tobacco Use  . Smoking status: Current Some Day Smoker  . Smokeless tobacco:  Never Used  Substance and Sexual Activity  . Alcohol use: Yes  . Drug use: Not on file  . Sexual activity: Not on file  Lifestyle  . Physical activity:    Days per week: Not on file    Minutes per session: Not on file  . Stress: Not on file  Relationships  . Social connections:    Talks on phone: Not on file    Gets together: Not on file    Attends religious service: Not on file    Active member of club or organization: Not on file    Attends meetings of clubs or organizations: Not on file    Relationship status: Not on file  . Intimate partner violence:    Fear of current or ex partner: Not on file    Emotionally abused: Not on file    Physically abused: Not on file    Forced sexual activity: Not on file  Other Topics Concern  . Not on file  Social History Narrative  . Not on file    No past surgical history on file.  No family history on file.  Allergies  Allergen Reactions  . Pollen Extract     Current Outpatient Medications on File Prior to Visit  Medication Sig Dispense Refill  . amLODipine (NORVASC) 10 MG tablet TAKE 1 TABLET BY  MOUTH EVERY DAY 90 tablet 1  . clotrimazole-betamethasone (LOTRISONE) cream Apply 1 application topically 2 (two) times daily. 45 g 3  . hydrochlorothiazide (HYDRODIURIL) 25 MG tablet Take 1 tablet (25 mg total) by mouth daily. For blood pressure. 90 tablet 0  . losartan (COZAAR) 100 MG tablet Take 1 tablet by mouth once daily for blood pressure. 90 tablet 0  . nicotine (NICODERM CQ - DOSED IN MG/24 HOURS) 21 mg/24hr patch Place 1 patch (21 mg total) onto the skin daily. 28 patch 1   No current facility-administered medications on file prior to visit.     BP 134/82   Pulse 62   Temp 98.2 F (36.8 C) (Oral)   Ht 5\' 10"  (1.778 m)   Wt 262 lb 4 oz (119 kg)   SpO2 97%   BMI 37.63 kg/m    Objective:   Physical Exam  Constitutional: He appears well-nourished.  Cardiovascular: Normal rate and regular rhythm.  Respiratory: Effort  normal and breath sounds normal. He has no wheezes.  Skin: Skin is warm and dry. Rash noted.  Full body rash with scaling and peeling, cracked skin. Cracked/dry skin to bilateral plantar feet, circular areas of reddened skin to bilateral dorsal feet, also with erythema between 1st and second toes on right foot. macopapular rash to face. Areas of redness to bilateral elbows and MCP joints of bilateral first digits.           Assessment & Plan:

## 2018-09-14 DIAGNOSIS — D485 Neoplasm of uncertain behavior of skin: Secondary | ICD-10-CM | POA: Diagnosis not present

## 2018-09-14 DIAGNOSIS — L404 Guttate psoriasis: Secondary | ICD-10-CM | POA: Diagnosis not present

## 2018-09-14 DIAGNOSIS — D225 Melanocytic nevi of trunk: Secondary | ICD-10-CM | POA: Diagnosis not present

## 2018-09-24 DIAGNOSIS — I10 Essential (primary) hypertension: Secondary | ICD-10-CM

## 2018-09-24 MED ORDER — LOSARTAN POTASSIUM 100 MG PO TABS: ORAL_TABLET | ORAL | 1 refills | Status: DC

## 2018-09-24 MED ORDER — AMLODIPINE BESYLATE 10 MG PO TABS: 10.0000 mg | ORAL_TABLET | Freq: Every day | ORAL | 1 refills | Status: DC

## 2018-09-24 MED ORDER — HYDROCHLOROTHIAZIDE 25 MG PO TABS: 25.0000 mg | ORAL_TABLET | Freq: Every day | ORAL | 1 refills | Status: DC

## 2018-10-12 DIAGNOSIS — L400 Psoriasis vulgaris: Secondary | ICD-10-CM | POA: Diagnosis not present

## 2018-11-28 DIAGNOSIS — I10 Essential (primary) hypertension: Secondary | ICD-10-CM

## 2018-11-28 MED ORDER — HYDROCHLOROTHIAZIDE 25 MG PO TABS: 25.0000 mg | ORAL_TABLET | Freq: Every day | ORAL | 1 refills | Status: DC

## 2019-02-25 DIAGNOSIS — Z0184 Encounter for antibody response examination: Secondary | ICD-10-CM

## 2019-02-25 DIAGNOSIS — R7303 Prediabetes: Secondary | ICD-10-CM

## 2019-02-25 DIAGNOSIS — Z0000 Encounter for general adult medical examination without abnormal findings: Secondary | ICD-10-CM

## 2019-02-25 DIAGNOSIS — Z125 Encounter for screening for malignant neoplasm of prostate: Secondary | ICD-10-CM

## 2019-02-25 DIAGNOSIS — I10 Essential (primary) hypertension: Secondary | ICD-10-CM

## 2019-02-25 NOTE — Telephone Encounter (Signed)
I replied to the patient about calling and setting up for lab work. Please review his question in regards to lab order. Thank you

## 2019-02-26 ENCOUNTER — Other Ambulatory Visit (INDEPENDENT_AMBULATORY_CARE_PROVIDER_SITE_OTHER): Payer: Commercial Managed Care - PPO

## 2019-02-26 DIAGNOSIS — R7303 Prediabetes: Secondary | ICD-10-CM

## 2019-02-26 DIAGNOSIS — Z125 Encounter for screening for malignant neoplasm of prostate: Secondary | ICD-10-CM

## 2019-02-26 DIAGNOSIS — I10 Essential (primary) hypertension: Secondary | ICD-10-CM | POA: Diagnosis not present

## 2019-02-26 DIAGNOSIS — Z0184 Encounter for antibody response examination: Secondary | ICD-10-CM

## 2019-02-26 LAB — COMPREHENSIVE METABOLIC PANEL
ALT: 54 U/L — ABNORMAL HIGH (ref 0–53)
AST: 30 U/L (ref 0–37)
Albumin: 4.6 g/dL (ref 3.5–5.2)
Alkaline Phosphatase: 35 U/L — ABNORMAL LOW (ref 39–117)
BUN: 8 mg/dL (ref 6–23)
CO2: 32 mEq/L (ref 19–32)
Calcium: 9.1 mg/dL (ref 8.4–10.5)
Chloride: 98 mEq/L (ref 96–112)
Creatinine, Ser: 0.65 mg/dL (ref 0.40–1.50)
GFR: 126.95 mL/min (ref >60.00)
Glucose, Bld: 91 mg/dL (ref 70–99)
Potassium: 4.2 mEq/L (ref 3.5–5.1)
Sodium: 137 mEq/L (ref 135–145)
Total Bilirubin: 0.5 mg/dL (ref 0.2–1.2)
Total Protein: 7.3 g/dL (ref 6.0–8.3)

## 2019-02-26 LAB — PSA: PSA: 2.65 ng/mL (ref 0.10–4.00)

## 2019-02-26 LAB — LIPID PANEL
Cholesterol: 204 mg/dL — ABNORMAL HIGH (ref 0–200)
HDL: 63.5 mg/dL (ref >39.00)
LDL Cholesterol: 118 mg/dL — ABNORMAL HIGH (ref 0–99)
NonHDL: 140.43
Total CHOL/HDL Ratio: 3
Triglycerides: 114 mg/dL (ref 0.0–149.0)
VLDL: 22.8 mg/dL (ref 0.0–40.0)

## 2019-02-26 LAB — HEMOGLOBIN A1C: Hgb A1c MFr Bld: 5.9 % (ref 4.6–6.5)

## 2019-02-27 LAB — SAR COV2 SEROLOGY (COVID19)AB(IGG),IA: SARS CoV2 AB IGG: NEGATIVE

## 2019-02-28 ENCOUNTER — Encounter: Payer: Self-pay | Admitting: Primary Care

## 2019-02-28 ENCOUNTER — Other Ambulatory Visit: Payer: Self-pay

## 2019-02-28 ENCOUNTER — Ambulatory Visit (INDEPENDENT_AMBULATORY_CARE_PROVIDER_SITE_OTHER): Payer: Commercial Managed Care - PPO | Admitting: Primary Care

## 2019-02-28 VITALS — BP 132/84 | HR 73 | Temp 98.6°F | Ht 70.0 in | Wt 257.8 lb

## 2019-02-28 DIAGNOSIS — I10 Essential (primary) hypertension: Secondary | ICD-10-CM | POA: Diagnosis not present

## 2019-02-28 DIAGNOSIS — Z0000 Encounter for general adult medical examination without abnormal findings: Secondary | ICD-10-CM

## 2019-02-28 DIAGNOSIS — M25562 Pain in left knee: Secondary | ICD-10-CM | POA: Diagnosis not present

## 2019-02-28 DIAGNOSIS — Z720 Tobacco use: Secondary | ICD-10-CM

## 2019-02-28 DIAGNOSIS — R7303 Prediabetes: Secondary | ICD-10-CM

## 2019-02-28 DIAGNOSIS — L309 Dermatitis, unspecified: Secondary | ICD-10-CM

## 2019-02-28 DIAGNOSIS — G8929 Other chronic pain: Secondary | ICD-10-CM

## 2019-02-28 NOTE — Assessment & Plan Note (Signed)
Slight improvement in A1C compared to last check. Commended him on changes in diet, encouraged exercise. Continue to monitor.

## 2019-02-28 NOTE — Assessment & Plan Note (Signed)
Stable in the office today, BMP unremarkable. Continue current regimen.

## 2019-02-28 NOTE — Assessment & Plan Note (Signed)
Chronic, overall stable.  

## 2019-02-28 NOTE — Patient Instructions (Addendum)
Start exercising. You should be getting 150 minutes of moderate intensity exercise weekly.  Continue to work on [employer].  Ensure you are consuming 64 ounces of water daily.  Schedule a follow up visit with Dr. Lorelei Pont as discussed.  It was a pleasure to see you today!   Preventive Care 2-57 Years Old, Male Preventive care refers to lifestyle choices and visits with your health care provider that can promote health and wellness. This includes:  A yearly physical exam. This is also called an annual well check.  Regular dental and eye exams.  Immunizations.  Screening for certain conditions.  Healthy lifestyle choices, such as eating a healthy diet, getting regular exercise, not using drugs or products that contain nicotine and tobacco, and limiting alcohol use. What can I expect for my preventive care visit? Physical exam Your health care provider will check:  Height and weight. These may be used to calculate body mass index (BMI), which is a measurement that tells if you are at a healthy weight.  Heart rate and blood pressure.  Your skin for abnormal spots. Counseling Your health care provider may ask you questions about:  Alcohol, tobacco, and drug use.  Emotional well-being.  Home and relationship well-being.  Sexual activity.  Eating habits.  Work and work Statistician. What immunizations do I need?  Influenza (flu) vaccine  This is recommended every year. Tetanus, diphtheria, and pertussis (Tdap) vaccine  You may need a Td booster every 10 years. Varicella (chickenpox) vaccine  You may need this vaccine if you have not already been vaccinated. Zoster (shingles) vaccine  You may need this after age 22. Measles, mumps, and rubella (MMR) vaccine  You may need at least one dose of MMR if you were born in 1957 or later. You may also need a second dose. Pneumococcal conjugate (PCV13) vaccine  You may need this if you have certain conditions and were not  previously vaccinated. Pneumococcal polysaccharide (PPSV23) vaccine  You may need one or two doses if you smoke cigarettes or if you have certain conditions. Meningococcal conjugate (MenACWY) vaccine  You may need this if you have certain conditions. Hepatitis A vaccine  You may need this if you have certain conditions or if you travel or work in places where you may be exposed to hepatitis A. Hepatitis B vaccine  You may need this if you have certain conditions or if you travel or work in places where you may be exposed to hepatitis B. Haemophilus influenzae type b (Hib) vaccine  You may need this if you have certain risk factors. Human papillomavirus (HPV) vaccine  If recommended by your health care provider, you may need three doses over 6 months. You may receive vaccines as individual doses or as more than one vaccine together in one shot (combination vaccines). Talk with your health care provider about the risks and benefits of combination vaccines. What tests do I need? Blood tests  Lipid and cholesterol levels. These may be checked every 5 years, or more frequently if you are over 83 years old.  Hepatitis C test.  Hepatitis B test. Screening  Lung cancer screening. You may have this screening every year starting at age 95 if you have a 30-pack-year history of smoking and currently smoke or have quit within the past 15 years.  Prostate cancer screening. Recommendations will vary depending on your family history and other risks.  Colorectal cancer screening. All adults should have this screening starting at age 46 and continuing until  age 30. Your health care provider may recommend screening at age 57 if you are at increased risk. You will have tests every 1-10 years, depending on your results and the type of screening test.  Diabetes screening. This is done by checking your blood sugar (glucose) after you have not eaten for a while (fasting). You may have this done every 1-3  years.  Sexually transmitted disease (STD) testing. Follow these instructions at home: Eating and drinking  Eat a diet that includes fresh fruits and vegetables, whole grains, lean protein, and low-fat dairy products.  Take vitamin and mineral supplements as recommended by your health care provider.  Do not drink alcohol if your health care provider tells you not to drink.  If you drink alcohol: ? Limit how much you have to 0-2 drinks a day. ? Be aware of how much alcohol is in your drink. In the [place] one drink equals one 12 oz bottle of beer (355 mL), one 5 oz glass of wine (148 mL), or one 1 oz glass of hard liquor (44 mL). Lifestyle  Take daily care of your teeth and gums.  Stay active. Exercise for at least 30 minutes on 5 or more days each week.  Do not use any products that contain nicotine or tobacco, such as cigarettes, e-cigarettes, and chewing tobacco. If you need help quitting, ask your health care provider.  If you are sexually active, practice safe sex. Use a condom or other form of protection to prevent STIs (sexually transmitted infections).  Talk with your health care provider about taking a low-dose aspirin every day starting at age 50. What's next?  Go to your health care provider once a year for a well check visit.  Ask your health care provider how often you should have your eyes and teeth checked.  Stay up to date on all vaccines. This information is not intended to replace advice given to you by your health care provider. Make sure you discuss any questions you have with your health care provider. Document Released: 08/28/2015 Document Revised: 07/26/2018 Document Reviewed: 07/26/2018 Elsevier Patient Education  2020 [employer].

## 2019-02-28 NOTE — Assessment & Plan Note (Signed)
Commended him on tobacco cessation.

## 2019-02-28 NOTE — Assessment & Plan Note (Signed)
Chronic for years, planning to schedule a follow up visit with Dr. Lorelei Pont with Sports Medicine.

## 2019-02-28 NOTE — Assessment & Plan Note (Signed)
Immunizations UTD. PSA UTD. Colon cancer screening UTD. Commended him on tobacco cessation. Discussed the importance of a healthy diet and regular exercise in order for weight loss, and to reduce the risk of any potential medical problems. Exam unremarkable. Labs reviewed.

## 2019-02-28 NOTE — Progress Notes (Signed)
Subjective:    Patient ID: Joseph Howe, male    DOB: 1961-05-21, 58 y.o.   MRN: [id number]  HPI  Joseph Howe is a 58 year old male who presents today for complete physical.  Immunizations: -Tetanus: Completed in 2018 -Influenza: Due this season   Diet: He endorses a healthy diet. He is eating more chicken, pork, shrimp, salads. Little starches/pasta. He is drinking mostly water, coffee, alcohol. Exercise: He is not exercising   Eye exam: Completed several years ago Dental exam: Completed recently  Colonoscopy: Completed Cologuard in 2019, negative PSA: 2.65 in 2020 Hep C Screen: Negative  BP Readings from Last 3 Encounters:  02/28/19 132/84  08/21/18 134/82  03/06/18 136/84   Wt Readings from Last 3 Encounters:  02/28/19 257 lb 12 oz (116.9 kg)  08/21/18 262 lb 4 oz (119 kg)  03/06/18 257 lb 4 oz (116.7 kg)     Review of Systems  Constitutional: Negative for unexpected weight change.  HENT: Negative for rhinorrhea.   Respiratory: Negative for cough and shortness of breath.   Cardiovascular: Negative for chest pain.  Gastrointestinal: Negative for constipation and diarrhea.  Genitourinary: Negative for difficulty urinating.  Musculoskeletal: Positive for arthralgias. Negative for myalgias.       Chronic left knee pain  Skin: Negative for rash.  Allergic/Immunologic: Negative for environmental allergies.  Neurological: Negative for dizziness, numbness and headaches.  Psychiatric/Behavioral: The patient is not nervous/anxious.        No past medical history on file.   Social History   Socioeconomic History  . Marital status: Married    Spouse name: Not on file  . Number of children: Not on file  . Years of education: Not on file  . Highest education level: Not on file  Occupational History  . Not on file  Social Needs  . Financial resource strain: Not on file  . Food insecurity    Worry: Not on file    Inability: Not on file  . Transportation needs   Medical: Not on file    Non-medical: Not on file  Tobacco Use  . Smoking status: Current Some Day Smoker  . Smokeless tobacco: Never Used  Substance and Sexual Activity  . Alcohol use: Yes  . Drug use: Not on file  . Sexual activity: Not on file  Lifestyle  . Physical activity    Days per week: Not on file    Minutes per session: Not on file  . Stress: Not on file  Relationships  . Social Herbalist on phone: Not on file    Gets together: Not on file    Attends religious service: Not on file    Active member of club or organization: Not on file    Attends meetings of clubs or organizations: Not on file    Relationship status: Not on file  . Intimate partner violence    Fear of current or ex partner: Not on file    Emotionally abused: Not on file    Physically abused: Not on file    Forced sexual activity: Not on file  Other Topics Concern  . Not on file  Social History Narrative  . Not on file    No past surgical history on file.  No family history on file.  Allergies  Allergen Reactions  . Pollen Extract     Current Outpatient Medications on File Prior to Visit  Medication Sig Dispense Refill  . amLODipine (NORVASC) 10 MG  tablet Take 1 tablet (10 mg total) by mouth daily. For blood pressure 90 tablet 1  . clotrimazole-betamethasone (LOTRISONE) cream Apply 1 application topically 2 (two) times daily. 45 g 3  . hydrochlorothiazide (HYDRODIURIL) 25 MG tablet Take 1 tablet (25 mg total) by mouth daily. For blood pressure. 90 tablet 1  . losartan (COZAAR) 100 MG tablet Take 1 tablet by mouth once daily for blood pressure. 90 tablet 1  . nicotine (NICODERM CQ - DOSED IN MG/24 HOURS) 21 mg/24hr patch Place 1 patch (21 mg total) onto the skin daily. 28 patch 1   No current facility-administered medications on file prior to visit.     BP 132/84   Pulse 73   Temp 98.6 F (37 C) (Temporal)   Ht 5\' 10"  (1.778 m)   Wt 257 lb 12 oz (116.9 kg)   SpO2 97%   BMI  36.98 kg/m    Objective:   Physical Exam  Constitutional: He is oriented to person, place, and time. He appears well-nourished.  HENT:  Mouth/Throat: No oropharyngeal exudate.  Eyes: Pupils are equal, round, and reactive to light. EOM are normal.  Neck: Neck supple. No thyromegaly present.  Cardiovascular: Normal rate and regular rhythm.  Respiratory: Effort normal and breath sounds normal.  GI: Soft. Bowel sounds are normal. There is no abdominal tenderness.  Musculoskeletal: Normal range of motion.  Neurological: He is alert and oriented to person, place, and time.  Skin: Skin is warm and dry.  Psychiatric: He has a normal mood and affect.           Assessment & Plan:

## 2019-03-10 NOTE — Progress Notes (Signed)
[name], MD Primary Care and Sports Medicine [facility] [place], [address] Phone: [phone]  FAX: [phone]  Joseph Howe - 58 y.o. male  MRN [id number]  Date of Birth: 02-20-1961  Visit Date: 03/11/2019  PCP: Pleas Koch, NP  Referred by: Pleas Koch, NP  Chief Complaint  Patient presents with  . Knee Pain    Left   Subjective:   Joseph Howe is a 57 y.o. very pleasant male patient who presents with the following:  58 year old gentleman who presents with knee pain. Body mass index is 37.45 kg/m.On prior knee films, he had relatively advanced OA, more in the medial compartment.   I saw him 08/2017.  His symptoms are off and on.  He currently has a moderate effusion.  He rates his pain 3 out of 10.  Intermittently he is has some pain.  He does take some ibuprofen 2 or 3 days a week, and he also has a compressive knee sleeve that he wears.  He also used some ice most nights.  Oral OTC's, ibuprofen.   Past Medical History, Surgical History, Social History, Family History, Problem List, Medications, and Allergies have been reviewed and updated if relevant.  Patient Active Problem List   Diagnosis Date Noted  . Tobacco abuse 08/24/2017  . Chronic knee pain 08/24/2017  . Preventative health care 01/27/2017  . Prediabetes 01/27/2017  . Rash and nonspecific skin eruption 09/28/2016  . Eczema 09/28/2016  . Bursitis of right knee 09/28/2016  . Essential hypertension 09/28/2016  . Epistaxis 09/28/2016    No past medical history on file.  No past surgical history on file.  Social History   Socioeconomic History  . Marital status: Married    Spouse name: Not on file  . Number of children: Not on file  . Years of education: Not on file  . Highest education level: Not on file  Occupational History  . Not on file  Social Needs  . Financial resource strain: Not on file  . Food  insecurity    Worry: Not on file    Inability: Not on file  . Transportation needs    Medical: Not on file    Non-medical: Not on file  Tobacco Use  . Smoking status: Former Research scientist (life sciences)  . Smokeless tobacco: Never Used  Substance and Sexual Activity  . Alcohol use: Yes  . Drug use: Not on file  . Sexual activity: Not on file  Lifestyle  . Physical activity    Days per week: Not on file    Minutes per session: Not on file  . Stress: Not on file  Relationships  . Social Herbalist on phone: Not on file    Gets together: Not on file    Attends religious service: Not on file    Active member of club or organization: Not on file    Attends meetings of clubs or organizations: Not on file    Relationship status: Not on file  . Intimate partner violence    Fear of current or ex partner: Not on file    Emotionally abused: Not on file    Physically abused: Not on file    Forced sexual activity: Not on file  Other Topics Concern  . Not on file  Social History Narrative  . Not on file    No family history on file.  Allergies  Allergen  Reactions  . Pollen Extract     Medication list reviewed and updated in full in [place].  GEN: No fevers, chills. Nontoxic. Primarily MSK c/o today. MSK: Detailed in the HPI GI: tolerating PO intake without difficulty Neuro: No numbness, parasthesias, or tingling associated. Otherwise the pertinent positives of the ROS are noted above.   Objective:   BP 140/86   Pulse 75   Temp 98.1 F (36.7 C) (Temporal)   Ht '5\' 10"'$  (1.778 m)   Wt 261 lb (118.4 kg)   BMI 37.45 kg/m    GEN: WDWN, NAD, Non-toxic, Alert & Oriented x 3 HEENT: Atraumatic, Normocephalic.  Ears and Nose: No external deformity. EXTR: No clubbing/cyanosis/edema NEURO: Normal gait.  PSYCH: Normally interactive. Conversant. Not depressed or anxious appearing.  Calm demeanor.    Left knee: He has a moderate effusion.  He also has medial and lateral joint  line tenderness.  The patient lacks 3 degrees of extension, and he is able to flex his knees to 95 degrees.  ACL, MCL, LCL, and PCL are intact.  He does have pain with flexion pinch and McMurray's.  Radiology: Dg Knee 4 Views W/patella Left   Result Date: 08/31/2017 CLINICAL DATA:  Left knee discomfort EXAM: LEFT KNEE - COMPLETE 4+ VIEW COMPARISON:  None. FINDINGS: There is tricompartmental degenerative joint disease of the left knee. There is primary involvement of the medial compartment where there is almost complete loss of joint space with sclerosis and spurring present. No acute fracture is seen. There may be a small amount of knee joint fluid present. IMPRESSION: 1. Age advanced tricompartmental degenerative joint disease the left knee primarily involving the medial compartment. 2. Question small amount of left knee joint fluid. Electronically Signed   By: Ivar Drape M.D.   On: 08/31/2017 17:19    Assessment and Plan:     ICD-10-CM   1. Primary osteoarthritis of left knee  M17.12    Significant osteoarthritis with bone-on-bone pathology in the left medial compartment.  Failure of virtually all conservative management thus far.  He does get some relief in the pool.  I reviewed with him some other conservative measures to try.  We are going to try to get him set up to do some Visco supplementation.  He has never tried this in the past.  Patient Instructions  OSTEOARTHRITIS:  For symptomatic relief:  Tylenol: 2 tablets up to 3-4 times a day Regular NSAIDS are helpful (avoid in kidney disease and ulcers)  Topical Capzaicin Cream, as needed (wear glove to put on) - THIS IS EXCEPTIONALLY HOT Supplements: Tart cherry juice has good scientific evidence  For flares, corticosteroid injections help. Hyaluronic Acid injections have good success, average relief is 6 months  Glucosamine and Chondroitin often helpful - will take about 3 months to see if you have an effect. If you do, great,  keep them up, if none at that point, no need to take in the future.  Omega-3 fish oils may help, 2 grams daily  Ice joints on bad days, 20 min, 2-3 x / day REGULAR EXERCISE: swimming, Yoga, Tai Chi, bicycle (NON-IMPACT activity)   Weight loss will always take stress off of the joints and back      Follow-up: No follow-ups on file.  No orders of the defined types were placed in this encounter.  No orders of the defined types were placed in this encounter.   Signed,  Maud Deed. Lisbeth Puller, MD   Outpatient Encounter Medications  as of 03/11/2019  Medication Sig  . amLODipine (NORVASC) 10 MG tablet Take 1 tablet (10 mg total) by mouth daily. For blood pressure  . clotrimazole-betamethasone (LOTRISONE) cream Apply 1 application topically 2 (two) times daily.  . hydrochlorothiazide (HYDRODIURIL) 25 MG tablet Take 1 tablet (25 mg total) by mouth daily. For blood pressure.  Marland Kitchen losartan (COZAAR) 100 MG tablet Take 1 tablet by mouth once daily for blood pressure.  . [DISCONTINUED] nicotine (NICODERM CQ - DOSED IN MG/24 HOURS) 21 mg/24hr patch Place 1 patch (21 mg total) onto the skin daily.   No facility-administered encounter medications on file as of 03/11/2019.

## 2019-03-11 ENCOUNTER — Other Ambulatory Visit: Payer: Self-pay

## 2019-03-11 ENCOUNTER — Encounter: Payer: Self-pay | Admitting: Family Medicine

## 2019-03-11 ENCOUNTER — Ambulatory Visit: Payer: Commercial Managed Care - PPO | Admitting: Family Medicine

## 2019-03-11 VITALS — BP 140/86 | HR 75 | Temp 98.1°F | Ht 70.0 in | Wt 261.0 lb

## 2019-03-11 DIAGNOSIS — M1712 Unilateral primary osteoarthritis, left knee: Secondary | ICD-10-CM | POA: Diagnosis not present

## 2019-03-11 NOTE — Patient Instructions (Signed)
OSTEOARTHRITIS:  For symptomatic relief:  Tylenol: 2 tablets up to 3-4 times a day Regular NSAIDS are helpful (avoid in kidney disease and ulcers)  Topical Capzaicin Cream, as needed (wear glove to put on) - THIS IS EXCEPTIONALLY HOT Supplements: Tart cherry juice has good scientific evidence  For flares, corticosteroid injections help. Hyaluronic Acid injections have good success, average relief is 6 months  Glucosamine and Chondroitin often helpful - will take about 3 months to see if you have an effect. If you do, great, keep them up, if none at that point, no need to take in the future.  Omega-3 fish oils may help, 2 grams daily  Ice joints on bad days, 20 min, 2-3 x / day REGULAR EXERCISE: swimming, Yoga, Tai Chi, bicycle (NON-IMPACT activity)   Weight loss will always take stress off of the joints and back

## 2019-03-15 ENCOUNTER — Other Ambulatory Visit: Payer: Self-pay | Admitting: Primary Care

## 2019-03-15 DIAGNOSIS — I10 Essential (primary) hypertension: Secondary | ICD-10-CM

## 2019-03-19 ENCOUNTER — Encounter: Payer: Self-pay | Admitting: Family Medicine

## 2019-04-09 ENCOUNTER — Encounter: Payer: Self-pay | Admitting: Family Medicine

## 2019-04-11 ENCOUNTER — Encounter: Payer: Self-pay | Admitting: Family Medicine

## 2019-04-11 ENCOUNTER — Other Ambulatory Visit: Payer: Self-pay

## 2019-04-11 ENCOUNTER — Ambulatory Visit: Payer: Commercial Managed Care - PPO | Admitting: Family Medicine

## 2019-04-11 VITALS — BP 120/80 | HR 78 | Temp 98.3°F | Ht 70.0 in | Wt 254.5 lb

## 2019-04-11 DIAGNOSIS — M1712 Unilateral primary osteoarthritis, left knee: Secondary | ICD-10-CM

## 2019-04-11 MED ORDER — SODIUM HYALURONATE 60 MG/3ML IX PRSY
60.0000 mg | PREFILLED_SYRINGE | Freq: Once | INTRA_ARTICULAR | Status: AC
  Administered 2019-04-11: 60 mg via INTRAMUSCULAR

## 2019-04-11 NOTE — Addendum Note (Signed)
Addended by: Carter Kitten on: 04/11/2019 03:46 PM   Modules accepted: Orders

## 2019-04-11 NOTE — Progress Notes (Signed)
     [name], MD Primary Care and Sports Medicine [facility] [place], [address] Phone: [phone]  FAX: [phone]  Joseph Howe - 59 y.o. male  MRN [id number]  Date of Birth: 05-01-1961  Visit Date: 04/11/2019  PCP: Pleas Koch, NP  Referred by: Pleas Koch, NP  No chief complaint on file.   Procedure only.  CLINICAL DATA:  Left knee discomfort  EXAM: LEFT KNEE - COMPLETE 4+ VIEW  COMPARISON:  None.  FINDINGS: There is tricompartmental degenerative joint disease of the left knee. There is primary involvement of the medial compartment where there is almost complete loss of joint space with sclerosis and spurring present. No acute fracture is seen. There may be a small amount of knee joint fluid present.  IMPRESSION: 1. Age advanced tricompartmental degenerative joint disease the left knee primarily involving the medial compartment. 2. Question small amount of left knee joint fluid.   Electronically Signed   By: Ivar Drape M.D.   On: 08/31/2017 17:19   Aspiration/Injection Procedure Note Emmeric Avetisyan [birth year] Date of procedure: 04/11/2019  Procedure: Large Joint Aspiration / Injection of Knee for Viscosupplementation, Left Medication: Durolane 60 mg / 3 mL Indications: Pain J7318, # 60 units  Procedure Details Patient verbally consented to procedure. Risks (including infection), benefits, and alternatives explained. Sterilely prepped with Chloraprep. Ethyl cholride used for anesthesia, then 7 cc of Lidocaine 1% used for anesthesia in the anterolateral position. Reprepped with Chloraprep.  40 cc of yellow joint fluid was aspirated and then Anterolaterall approach used to inject joint without difficulty, injected with Durolane 60 mg/3 mL. No complications with procedure and tolerated well.   Signed,  Maud Deed. Tejuan Gholson, MD

## 2019-05-02 ENCOUNTER — Ambulatory Visit: Payer: Commercial Managed Care - PPO

## 2019-05-23 ENCOUNTER — Encounter: Payer: Self-pay | Admitting: Family Medicine

## 2019-05-28 ENCOUNTER — Other Ambulatory Visit: Payer: Self-pay | Admitting: Primary Care

## 2019-05-28 DIAGNOSIS — I10 Essential (primary) hypertension: Secondary | ICD-10-CM

## 2019-06-05 ENCOUNTER — Other Ambulatory Visit: Payer: Self-pay | Admitting: Primary Care

## 2019-06-06 ENCOUNTER — Encounter: Payer: Self-pay | Admitting: Primary Care

## 2019-06-06 ENCOUNTER — Other Ambulatory Visit: Payer: Self-pay

## 2019-06-06 ENCOUNTER — Ambulatory Visit (INDEPENDENT_AMBULATORY_CARE_PROVIDER_SITE_OTHER): Payer: Commercial Managed Care - PPO | Admitting: Primary Care

## 2019-06-06 DIAGNOSIS — R040 Epistaxis: Secondary | ICD-10-CM

## 2019-06-06 NOTE — Assessment & Plan Note (Addendum)
Seems to be seasonal and/or during drier weather.  No obvious foreign body/lesion noted on exam.  Recommended he use saline nasal sprays, air humidifiers, Vaseline for moisturizing.  Discussed to avoid steroidal nasal sprays, antihistamines.  We will stop fish oil.  If no improvement with these interventions then will send to ENT for evaluation.  He is stable for outpatient treatment at this time.

## 2019-06-06 NOTE — Progress Notes (Signed)
Subjective:    Patient ID: Joseph Howe, male    DOB: 04/01/1961, 58 y.o.   MRN: [id number]  HPI  Joseph Howe is a 58 year old male with a history of epistaxis, hypertension, prediabetes who presents today with a chief complaint of epistaxis.  History of intermittent nosebleeds one year ago around this time of year, overall mild which lasted for about two weeks total.  Two weeks ago today he developed once daily nosebleeds, sometimes with very heavy flow. He will lean his head back and nosebleeds will stop after 3-5 minutes. Nosebleeds only occur when he's upright walking and sitting.  Nosebleeds only occur from the left nare.  He's taking Ibuprofen on the weekends only. He is not taking antihistamines or using nasal sprays. He did start taking Fish Oil nearly two weeks ago.  He will be traveling to [place] this weekend.  BP Readings from Last 3 Encounters:  06/06/19 124/80  04/11/19 120/80  03/11/19 140/86     Review of Systems  HENT: Positive for nosebleeds.   Respiratory: Negative for cough.   Gastrointestinal: Negative for abdominal pain.  Skin: Negative for pallor.  Allergic/Immunologic: Positive for environmental allergies.  Neurological: Negative for dizziness.       No past medical history on file.   Social History   Socioeconomic History  . Marital status: Married    Spouse name: Not on file  . Number of children: Not on file  . Years of education: Not on file  . Highest education level: Not on file  Occupational History  . Not on file  Social Needs  . Financial resource strain: Not on file  . Food insecurity    Worry: Not on file    Inability: Not on file  . Transportation needs    Medical: Not on file    Non-medical: Not on file  Tobacco Use  . Smoking status: Former Research scientist (life sciences)  . Smokeless tobacco: Never Used  Substance and Sexual Activity  . Alcohol use: Yes  . Drug use: Not on file  . Sexual activity: Not on file  Lifestyle  . Physical activity   Days per week: Not on file    Minutes per session: Not on file  . Stress: Not on file  Relationships  . Social Herbalist on phone: Not on file    Gets together: Not on file    Attends religious service: Not on file    Active member of club or organization: Not on file    Attends meetings of clubs or organizations: Not on file    Relationship status: Not on file  . Intimate partner violence    Fear of current or ex partner: Not on file    Emotionally abused: Not on file    Physically abused: Not on file    Forced sexual activity: Not on file  Other Topics Concern  . Not on file  Social History Narrative  . Not on file    No past surgical history on file.  No family history on file.  Allergies  Allergen Reactions  . Pollen Extract     Current Outpatient Medications on File Prior to Visit  Medication Sig Dispense Refill  . amLODipine (NORVASC) 10 MG tablet TAKE 1 TABLET BY MOUTH EVERY DAY FOR BLOOD PRESSURE 90 tablet 1  . clotrimazole-betamethasone (LOTRISONE) cream Apply 1 application topically 2 (two) times daily. 45 g 3  . hydrochlorothiazide (HYDRODIURIL) 25 MG tablet TAKE 1 TABLET BY  MOUTH EVERY DAY FOR BLOOD PRESSURE 90 tablet 1  . losartan (COZAAR) 100 MG tablet TAKE 1 TABLET BY MOUTH EVERY DAY FOR BLOOD PRESSURE 90 tablet 1   No current facility-administered medications on file prior to visit.     BP 124/80   Pulse 60   Temp (!) 97.4 F (36.3 C) (Temporal)   Ht 5\' 10"  (1.778 m)   Wt 257 lb 12 oz (116.9 kg)   SpO2 96%   BMI 36.98 kg/m    Objective:   Physical Exam  Constitutional: He appears well-nourished.  HENT:  Nose: Mucosal edema present.  Evidence of earlier nosebleed to left nare, no active bleeding.  Neck: Neck supple.  Cardiovascular: Normal rate and regular rhythm.  Respiratory: Effort normal and breath sounds normal.  Skin: Skin is warm and dry.           Assessment & Plan:

## 2019-06-06 NOTE — Patient Instructions (Addendum)
Start a saline nasal spray to help with moisture to the nasal cavity.  Use an air humidifier to prevent dryness.  You can also use some Vaseline once daily as discussed.  Avoid Fish Oil, antihistamines, steroidal nasal sprays.  It was a pleasure to see you today!   Nosebleed, Adult A nosebleed is when blood comes out of the nose. Nosebleeds are common. Usually, they are not a sign of a serious condition. Nosebleeds can happen if a small blood vessel in your nose starts to bleed or if the lining of your nose (mucous membrane) cracks. They are commonly caused by:  Allergies.  Colds.  Picking your nose.  Blowing your nose too hard.  An injury from sticking an object into your nose or getting hit in the nose.  Dry or cold air. Less common causes of nosebleeds include:  Toxic fumes.  Something abnormal in the nose or in the air-filled spaces in the bones of the face (sinuses).  Growths in the nose, such as polyps.  Medicines or conditions that cause blood to clot slowly.  Certain illnesses or procedures that irritate or dry out the nasal passages. Follow these instructions at home: When you have a nosebleed:   Sit down and tilt your head slightly forward.  Use a clean towel or tissue to pinch your nostrils under the bony part of your nose. After 10 minutes, let go of your nose and see if bleeding starts again. Do not release pressure before that time. If there is still bleeding, repeat the pinching and holding for 10 minutes until the bleeding stops.  Do not place tissues or gauze in the nose to stop bleeding.  Avoid lying down and avoid tilting your head backward. That may make blood collect in the throat and cause gagging or coughing.  Use a nasal spray decongestant to help with a nosebleed as told by your health care provider.  Do not use petroleum jelly or mineral oil in your nose. It can drip into your lungs. After a nosebleed:  Avoid blowing your nose or  sniffing for a number of hours.  Avoid straining, lifting, or bending at the waist for several days. You may resume other normal activities as you are able.  Use saline spray or a humidifier as told by your health care provider.  Aspirinand blood thinners make bleeding more likely. If you are prescribed these medicines and you suffer from nosebleeds: ? Ask your health care provider if you should stop taking the medicines or if you should adjust the dose. ? Do not stop taking medicines that your health care provider has recommended unless told by your health care provider.  If your nosebleed was caused by dry mucous membranes, use over-the-counter saline nasal spray or gel. This will keep the mucous membranes moist and allow them to heal. If you must use a lubricant: ? Choose one that is water-soluble. ? Use only as much as you need and use it only as often as needed. ? Do not lie down until several hours after you use it. Contact a health care provider if:  You have a fever.  You get nosebleeds often or more often than usual.  You bruise very easily.  You have a nosebleed from having something stuck in your nose.  You have bleeding in your mouth.  You vomit or cough up brown material.  You have a nosebleed after you start a new medicine. Get help right away if:  You have a nosebleed  after a fall or a head injury.  Your nosebleed does not go away after 20 minutes.  You feel dizzy or weak.  You have unusual bleeding from other parts of your body.  You have unusual bruising on other parts of your body.  You become sweaty.  You vomit blood. This information is not intended to replace advice given to you by your health care provider. Make sure you discuss any questions you have with your health care provider. Document Released: 05/11/2005 Document Revised: 10/31/2017 Document Reviewed: 02/16/2016 Elsevier Patient Education  2020 [employer].

## 2019-09-13 ENCOUNTER — Other Ambulatory Visit: Payer: Self-pay | Admitting: Primary Care

## 2019-09-13 DIAGNOSIS — I10 Essential (primary) hypertension: Secondary | ICD-10-CM

## 2019-09-27 ENCOUNTER — Encounter: Payer: Self-pay | Admitting: Family Medicine

## 2019-09-27 NOTE — Telephone Encounter (Signed)
I have one Durolane in stock.

## 2019-09-27 NOTE — Telephone Encounter (Signed)
Durolane 60 mg/3 mL   Can we make sure we have some Duralane for him?  With f/u duralane inj appt after 10/12/2019

## 2019-10-13 NOTE — Progress Notes (Signed)
     Joseph Stepka T. Ayeden Gladman, MD Primary Care and Sports Medicine [facility] [place], [address] Phone: [phone]  FAX: [phone]  Joseph Howe - 59 y.o. male  MRN [id number]  Date of Birth: July 05, 1961  Visit Date: 10/14/2019  PCP: Pleas Koch, NP  Referred by: Pleas Koch, NP  Chief Complaint  Patient presents with  . Knee Pain    Left    This visit occurred during the SARS-CoV-2 public health emergency.  Safety protocols were in place, including screening questions prior to the visit, additional usage of staff PPE, and extensive cleaning of exam room while observing appropriate contact time as indicated for disinfecting solutions.    He had a great response to a Durolane injection previously, he is here today for follow-up Durling injection.  L knee duralane: injection only  Aspiration/Injection Procedure Note Joseph Howe Oct 29, 1960 Date of procedure: 10/14/2019  Procedure: Large Joint Aspiration / Injection of Knee for Viscosupplementation, LEFT Medication: Durolane 60 mg / 3 mL Indications: Pain J7318, # 60 units  Procedure Details Patient verbally consented to procedure. Risks (including infection), benefits, and alternatives explained. Sterilely prepped with Chloraprep. Ethyl cholride used for anesthesia, then 7 cc of Lidocaine 1% used for anesthesia in the anterolateral position. Reprepped with Chloraprep.  Anteromedial approach used to inject joint without difficulty, injected with Durolane 60 mg/3 mL. No complications with procedure and tolerated well.     ICD-10-CM   1. Primary osteoarthritis of left knee  M17.12 Sodium Hyaluronate PRSY 60 mg    Follow-up: No follow-ups on file.  Meds ordered this encounter  Medications  . Sodium Hyaluronate PRSY 60 mg   Signed,  Ishitha Roper T. Starlina Lapre, MD   Outpatient Encounter Medications as of 10/14/2019  Medication Sig  . amLODipine (NORVASC) 10 MG tablet  TAKE 1 TABLET BY MOUTH EVERY DAY FOR BLOOD PRESSURE  . clotrimazole-betamethasone (LOTRISONE) cream Apply 1 application topically 2 (two) times daily.  Marland Kitchen losartan (COZAAR) 100 MG tablet TAKE 1 TABLET BY MOUTH EVERY DAY FOR BLOOD PRESSURE  . [DISCONTINUED] hydrochlorothiazide (HYDRODIURIL) 25 MG tablet TAKE 1 TABLET BY MOUTH EVERY DAY FOR BLOOD PRESSURE  . [EXPIRED] Sodium Hyaluronate PRSY 60 mg    No facility-administered encounter medications on file as of 10/14/2019.

## 2019-10-14 ENCOUNTER — Other Ambulatory Visit: Payer: Self-pay

## 2019-10-14 ENCOUNTER — Ambulatory Visit: Payer: Commercial Managed Care - PPO | Admitting: Family Medicine

## 2019-10-14 ENCOUNTER — Encounter: Payer: Self-pay | Admitting: Family Medicine

## 2019-10-14 VITALS — BP 156/80 | HR 99 | Temp 98.6°F | Ht 70.0 in | Wt 258.8 lb

## 2019-10-14 DIAGNOSIS — M1712 Unilateral primary osteoarthritis, left knee: Secondary | ICD-10-CM

## 2019-10-14 MED ORDER — SODIUM HYALURONATE 60 MG/3ML IX PRSY
60.0000 mg | PREFILLED_SYRINGE | Freq: Once | INTRA_ARTICULAR | Status: AC
  Administered 2019-10-14: 12:00:00 60 mg via INTRA_ARTICULAR

## 2019-11-29 ENCOUNTER — Other Ambulatory Visit: Payer: Self-pay | Admitting: Primary Care

## 2019-11-29 DIAGNOSIS — I10 Essential (primary) hypertension: Secondary | ICD-10-CM

## 2020-01-07 ENCOUNTER — Encounter: Payer: Self-pay | Admitting: Family Medicine

## 2020-01-07 DIAGNOSIS — L309 Dermatitis, unspecified: Secondary | ICD-10-CM

## 2020-01-07 MED ORDER — CLOTRIMAZOLE-BETAMETHASONE 1-0.05 % EX CREA: 1.0000 "application " | TOPICAL_CREAM | Freq: Two times a day (BID) | CUTANEOUS | 0 refills | Status: DC

## 2020-02-28 ENCOUNTER — Other Ambulatory Visit: Payer: Self-pay | Admitting: Primary Care

## 2020-02-28 DIAGNOSIS — Z114 Encounter for screening for human immunodeficiency virus [HIV]: Secondary | ICD-10-CM

## 2020-02-28 DIAGNOSIS — I10 Essential (primary) hypertension: Secondary | ICD-10-CM

## 2020-02-28 DIAGNOSIS — Z125 Encounter for screening for malignant neoplasm of prostate: Secondary | ICD-10-CM

## 2020-02-28 DIAGNOSIS — R7303 Prediabetes: Secondary | ICD-10-CM

## 2020-03-03 ENCOUNTER — Other Ambulatory Visit: Payer: Self-pay | Admitting: Primary Care

## 2020-03-03 DIAGNOSIS — I10 Essential (primary) hypertension: Secondary | ICD-10-CM

## 2020-03-11 ENCOUNTER — Other Ambulatory Visit: Payer: Self-pay

## 2020-03-11 ENCOUNTER — Other Ambulatory Visit (INDEPENDENT_AMBULATORY_CARE_PROVIDER_SITE_OTHER): Payer: Commercial Managed Care - PPO

## 2020-03-11 DIAGNOSIS — Z114 Encounter for screening for human immunodeficiency virus [HIV]: Secondary | ICD-10-CM

## 2020-03-11 DIAGNOSIS — Z125 Encounter for screening for malignant neoplasm of prostate: Secondary | ICD-10-CM

## 2020-03-11 DIAGNOSIS — I10 Essential (primary) hypertension: Secondary | ICD-10-CM | POA: Diagnosis not present

## 2020-03-11 DIAGNOSIS — R7303 Prediabetes: Secondary | ICD-10-CM

## 2020-03-11 LAB — CBC
HCT: 43.6 % (ref 39.0–52.0)
Hemoglobin: 14.7 g/dL (ref 13.0–17.0)
MCHC: 33.8 g/dL (ref 30.0–36.0)
MCV: 95.9 fl (ref 78.0–100.0)
Platelets: 240 10*3/uL (ref 150.0–400.0)
RBC: 4.54 Mil/uL (ref 4.22–5.81)
RDW: 13.8 % (ref 11.5–15.5)
WBC: 3.8 10*3/uL — ABNORMAL LOW (ref 4.0–10.5)

## 2020-03-11 LAB — COMPREHENSIVE METABOLIC PANEL
ALT: 34 U/L (ref 0–53)
AST: 28 U/L (ref 0–37)
Albumin: 4.4 g/dL (ref 3.5–5.2)
Alkaline Phosphatase: 34 U/L — ABNORMAL LOW (ref 39–117)
BUN: 9 mg/dL (ref 6–23)
CO2: 26 mEq/L (ref 19–32)
Calcium: 9.2 mg/dL (ref 8.4–10.5)
Chloride: 104 mEq/L (ref 96–112)
Creatinine, Ser: 0.66 mg/dL (ref 0.40–1.50)
GFR: 124.27 mL/min (ref >60.00)
Glucose, Bld: 86 mg/dL (ref 70–99)
Potassium: 4.2 mEq/L (ref 3.5–5.1)
Sodium: 140 mEq/L (ref 135–145)
Total Bilirubin: 0.6 mg/dL (ref 0.2–1.2)
Total Protein: 7.1 g/dL (ref 6.0–8.3)

## 2020-03-11 LAB — PSA: PSA: 2.48 ng/mL (ref 0.10–4.00)

## 2020-03-11 LAB — HEMOGLOBIN A1C: Hgb A1c MFr Bld: 5.9 % (ref 4.6–6.5)

## 2020-03-12 LAB — HIV ANTIBODY (ROUTINE TESTING W REFLEX): HIV 1&2 Ab, 4th Generation: NONREACTIVE

## 2020-03-18 ENCOUNTER — Other Ambulatory Visit: Payer: Self-pay

## 2020-03-18 ENCOUNTER — Ambulatory Visit (INDEPENDENT_AMBULATORY_CARE_PROVIDER_SITE_OTHER): Payer: Commercial Managed Care - PPO | Admitting: Primary Care

## 2020-03-18 ENCOUNTER — Encounter: Payer: Self-pay | Admitting: Primary Care

## 2020-03-18 VITALS — BP 140/84 | HR 100 | Temp 97.5°F | Ht 70.0 in | Wt 256.8 lb

## 2020-03-18 DIAGNOSIS — Z0000 Encounter for general adult medical examination without abnormal findings: Secondary | ICD-10-CM | POA: Diagnosis not present

## 2020-03-18 DIAGNOSIS — Z720 Tobacco use: Secondary | ICD-10-CM | POA: Diagnosis not present

## 2020-03-18 DIAGNOSIS — G8929 Other chronic pain: Secondary | ICD-10-CM

## 2020-03-18 DIAGNOSIS — M25562 Pain in left knee: Secondary | ICD-10-CM

## 2020-03-18 DIAGNOSIS — I10 Essential (primary) hypertension: Secondary | ICD-10-CM

## 2020-03-18 DIAGNOSIS — L309 Dermatitis, unspecified: Secondary | ICD-10-CM | POA: Diagnosis not present

## 2020-03-18 DIAGNOSIS — Z23 Encounter for immunization: Secondary | ICD-10-CM

## 2020-03-18 DIAGNOSIS — R7303 Prediabetes: Secondary | ICD-10-CM

## 2020-03-18 MED ORDER — CHANTIX STARTING MONTH PAK 0.5 MG X 11 & 1 MG X 42 PO TABS: ORAL_TABLET | ORAL | 0 refills | Status: DC

## 2020-03-18 NOTE — Assessment & Plan Note (Signed)
Overall doing well on injections, following with sports medicine.

## 2020-03-18 NOTE — Progress Notes (Signed)
Subjective:    Patient ID: Joseph Howe, male    DOB: May 30, 1961, 59 y.o.   MRN: [id number]  HPI  This visit occurred during the SARS-CoV-2 public health emergency.  Safety protocols were in place, including screening questions prior to the visit, additional usage of staff PPE, and extensive cleaning of exam room while observing appropriate contact time as indicated for disinfecting solutions.   Joseph Howe is a 59 year old male who presents today for complete physical.  He also reports a chronic cough that began about three months ago. He recently started taking Claritin, inconsistently for the last three weeks, has noticed improvement. He is smoking 1/2 PPD on average. Has been smoking consistently for the last three years, quit smoking for 14 years prior. He was successful on Chantix in the past, would like to try this again.   Immunizations: -Tetanus: Completed in 2018 -Influenza: Due this season  -Shingles: Never completed  -Covid-19: Completed series  Diet: He endorses a fair diet. Exercise: He is not exercising due to chronic knee pain.   Eye exam: Completed in 2021 Dental exam: Completes semi-annually   Colonoscopy: Completed Cologuard in 2019, negative.  PSA: 2.48 in 2021 Hep C Screen: Negative  Wt Readings from Last 3 Encounters:  03/18/20 256 lb 12 oz (116.5 kg)  10/14/19 258 lb 12 oz (117.4 kg)  06/06/19 257 lb 12 oz (116.9 kg)   BP Readings from Last 3 Encounters:  03/18/20 140/84  10/14/19 (!) 156/80  06/06/19 124/80   He has had his BP checked several times recently which is running 120's/70's.   The 10-year ASCVD risk score Mikey Bussing DC Brooke Bonito., et al., 2013) is: 7.7%   Values used to calculate the score:     Age: 27 years     Sex: Male     Is Non-Hispanic African American: No     Diabetic: No     Tobacco smoker: No     Systolic Blood Pressure: 244 mmHg     Is BP treated: Yes     HDL Cholesterol: 63.5 mg/dL     Total Cholesterol: 204 mg/dL   Review of  Systems  Constitutional: Negative for unexpected weight change.  HENT: Negative for rhinorrhea.   Respiratory: Positive for cough. Negative for shortness of breath.   Cardiovascular: Negative for chest pain.  Gastrointestinal: Negative for constipation and diarrhea.  Genitourinary: Negative for difficulty urinating.  Musculoskeletal: Positive for arthralgias.       Chronic knee pain  Skin: Negative for rash.  Allergic/Immunologic: Negative for environmental allergies.  Neurological: Negative for dizziness, numbness and headaches.  Psychiatric/Behavioral: The patient is not nervous/anxious.        Past Medical History:  Diagnosis Date  . Eczema   . Epistaxis   . Essential hypertension   . Prediabetes      Social History   Socioeconomic History  . Marital status: Married    Spouse name: Not on file  . Number of children: Not on file  . Years of education: Not on file  . Highest education level: Not on file  Occupational History  . Not on file  Tobacco Use  . Smoking status: Former Research scientist (life sciences)  . Smokeless tobacco: Never Used  Substance and Sexual Activity  . Alcohol use: Yes  . Drug use: Not on file  . Sexual activity: Not on file  Other Topics Concern  . Not on file  Social History Narrative  . Not on file   Social Determinants  of Health   Financial Resource Strain:   . Difficulty of Paying Living Expenses:   Food Insecurity:   . Worried About Charity fundraiser in the Last Year:   . Arboriculturist in the Last Year:   Transportation Needs:   . Film/video editor (Medical):   Marland Kitchen Lack of Transportation (Non-Medical):   Physical Activity:   . Days of Exercise per Week:   . Minutes of Exercise per Session:   Stress:   . Feeling of Stress :   Social Connections:   . Frequency of Communication with Friends and Family:   . Frequency of Social Gatherings with Friends and Family:   . Attends Religious Services:   . Active Member of Clubs or Organizations:   .  Attends Archivist Meetings:   Marland Kitchen Marital Status:   Intimate Partner Violence:   . Fear of Current or Ex-Partner:   . Emotionally Abused:   Marland Kitchen Physically Abused:   . Sexually Abused:     No past surgical history on file.  No family history on file.  Allergies  Allergen Reactions  . Pollen Extract     Current Outpatient Medications on File Prior to Visit  Medication Sig Dispense Refill  . amLODipine (NORVASC) 10 MG tablet TAKE 1 TABLET BY MOUTH EVERY DAY FOR BLOOD PRESSURE 90 tablet 1  . clotrimazole-betamethasone (LOTRISONE) cream Apply 1 application topically 2 (two) times daily. 45 g 0  . losartan (COZAAR) 100 MG tablet TAKE 1 TABLET BY MOUTH EVERY DAY FOR BLOOD PRESSURE 90 tablet 1   No current facility-administered medications on file prior to visit.    BP 140/84   Pulse 100   Temp (!) 97.5 F (36.4 C) (Temporal)   Ht 5\' 10"  (1.778 m)   Wt 256 lb 12 oz (116.5 kg)   SpO2 97%   BMI 36.84 kg/m    Objective:   Physical Exam HENT:     Right Ear: Tympanic membrane and ear canal normal.     Left Ear: Tympanic membrane and ear canal normal.  Eyes:     Pupils: Pupils are equal, round, and reactive to light.  Cardiovascular:     Rate and Rhythm: Normal rate and regular rhythm.  Pulmonary:     Effort: Pulmonary effort is normal.     Breath sounds: Normal breath sounds.  Abdominal:     General: Bowel sounds are normal.     Palpations: Abdomen is soft.     Tenderness: There is no abdominal tenderness.  Musculoskeletal:        General: Normal range of motion.     Cervical back: Neck supple.     Comments: Anterior left knee swelling, chronic.   Skin:    General: Skin is warm and dry.  Neurological:     Mental Status: He is alert and oriented to person, place, and time.     Cranial Nerves: No cranial nerve deficit.     Deep Tendon Reflexes:     Reflex Scores:      Patellar reflexes are 2+ on the right side and 2+ on the left side. Psychiatric:         Mood and Affect: Mood normal.            Assessment & Plan:

## 2020-03-18 NOTE — Assessment & Plan Note (Signed)
Above goal in the office today, home readings are at goal. Continue amlodipine and losartan. CMP reviewed. He will continue to monitor.

## 2020-03-18 NOTE — Assessment & Plan Note (Signed)
First Shingrix dose provided, other immunizations UTD. Colon cancer screening UTD, due again in 2022. PSA UTD.  Discussed the importance of a healthy diet and regular exercise in order for weight loss, and to reduce the risk of any potential medical problems.  Exam today stable. Labs reviewed. Lipid panel pending.

## 2020-03-18 NOTE — Assessment & Plan Note (Signed)
Well controlled on PRN clotrimazole-betamethasone, continue same.

## 2020-03-18 NOTE — Assessment & Plan Note (Signed)
Recent A1C is exactly the same as it was last year. Family history of diabetes.  Discussed the importance of a healthy diet and regular exercise in order for weight loss, and to reduce the risk of any potential medical problems.  Continue to monitor.

## 2020-03-18 NOTE — Assessment & Plan Note (Signed)
Continues to smoke 1/2 PPD x 3 years. He is considering quitting, Rx for Chantix sent to pharmacy as he was successful on this in the past. He will call for a refill if needed.

## 2020-03-18 NOTE — Addendum Note (Signed)
Addended by: Jacqualin Combes on: 03/18/2020 04:20 PM   Modules accepted: Orders

## 2020-03-18 NOTE — Patient Instructions (Signed)
Stop by the lab prior to leaving today. I will notify you of your results once received.   Continue to work on a healthy diet. Ensure you are consuming 64 ounces of water daily.  Schedule a nurse visit to return in 2-6 months for your second Shingles shot.  It was a pleasure to see you today!   Preventive Care 5-58 Years Old, Male Preventive care refers to lifestyle choices and visits with your health care provider that can promote health and wellness. This includes:  A yearly physical exam. This is also called an annual well check.  Regular dental and eye exams.  Immunizations.  Screening for certain conditions.  Healthy lifestyle choices, such as eating a healthy diet, getting regular exercise, not using drugs or products that contain nicotine and tobacco, and limiting alcohol use. What can I expect for my preventive care visit? Physical exam Your health care provider will check:  Height and weight. These may be used to calculate body mass index (BMI), which is a measurement that tells if you are at a healthy weight.  Heart rate and blood pressure.  Your skin for abnormal spots. Counseling Your health care provider may ask you questions about:  Alcohol, tobacco, and drug use.  Emotional well-being.  Home and relationship well-being.  Sexual activity.  Eating habits.  Work and work Statistician. What immunizations do I need?  Influenza (flu) vaccine  This is recommended every year. Tetanus, diphtheria, and pertussis (Tdap) vaccine  You may need a Td booster every 10 years. Varicella (chickenpox) vaccine  You may need this vaccine if you have not already been vaccinated. Zoster (shingles) vaccine  You may need this after age 28. Measles, mumps, and rubella (MMR) vaccine  You may need at least one dose of MMR if you were born in 1957 or later. You may also need a second dose. Pneumococcal conjugate (PCV13) vaccine  You may need this if you have certain  conditions and were not previously vaccinated. Pneumococcal polysaccharide (PPSV23) vaccine  You may need one or two doses if you smoke cigarettes or if you have certain conditions. Meningococcal conjugate (MenACWY) vaccine  You may need this if you have certain conditions. Hepatitis A vaccine  You may need this if you have certain conditions or if you travel or work in places where you may be exposed to hepatitis A. Hepatitis B vaccine  You may need this if you have certain conditions or if you travel or work in places where you may be exposed to hepatitis B. Haemophilus influenzae type b (Hib) vaccine  You may need this if you have certain risk factors. Human papillomavirus (HPV) vaccine  If recommended by your health care provider, you may need three doses over 6 months. You may receive vaccines as individual doses or as more than one vaccine together in one shot (combination vaccines). Talk with your health care provider about the risks and benefits of combination vaccines. What tests do I need? Blood tests  Lipid and cholesterol levels. These may be checked every 5 years, or more frequently if you are over 20 years old.  Hepatitis C test.  Hepatitis B test. Screening  Lung cancer screening. You may have this screening every year starting at age 70 if you have a 30-pack-year history of smoking and currently smoke or have quit within the past 15 years.  Prostate cancer screening. Recommendations will vary depending on your family history and other risks.  Colorectal cancer screening. All adults should have  this screening starting at age 83 and continuing until age 50. Your health care provider may recommend screening at age 44 if you are at increased risk. You will have tests every 1-10 years, depending on your results and the type of screening test.  Diabetes screening. This is done by checking your blood sugar (glucose) after you have not eaten for a while (fasting). You may  have this done every 1-3 years.  Sexually transmitted disease (STD) testing. Follow these instructions at home: Eating and drinking  Eat a diet that includes fresh fruits and vegetables, whole grains, lean protein, and low-fat dairy products.  Take vitamin and mineral supplements as recommended by your health care provider.  Do not drink alcohol if your health care provider tells you not to drink.  If you drink alcohol: ? Limit how much you have to 0-2 drinks a day. ? Be aware of how much alcohol is in your drink. In the [place] one drink equals one 12 oz bottle of beer (355 mL), one 5 oz glass of wine (148 mL), or one 1 oz glass of hard liquor (44 mL). Lifestyle  Take daily care of your teeth and gums.  Stay active. Exercise for at least 30 minutes on 5 or more days each week.  Do not use any products that contain nicotine or tobacco, such as cigarettes, e-cigarettes, and chewing tobacco. If you need help quitting, ask your health care provider.  If you are sexually active, practice safe sex. Use a condom or other form of protection to prevent STIs (sexually transmitted infections).  Talk with your health care provider about taking a low-dose aspirin every day starting at age 51. What's next?  Go to your health care provider once a year for a well check visit.  Ask your health care provider how often you should have your eyes and teeth checked.  Stay up to date on all vaccines. This information is not intended to replace advice given to you by your health care provider. Make sure you discuss any questions you have with your health care provider. Document Revised: 07/26/2018 Document Reviewed: 07/26/2018 Elsevier Patient Education  2020 [employer].

## 2020-03-19 LAB — LIPID PANEL
Cholesterol: 185 mg/dL (ref 0–200)
HDL: 71.2 mg/dL (ref >39.00)
LDL Cholesterol: 90 mg/dL (ref 0–99)
NonHDL: 114.05
Total CHOL/HDL Ratio: 3
Triglycerides: 120 mg/dL (ref 0.0–149.0)
VLDL: 24 mg/dL (ref 0.0–40.0)

## 2020-05-11 ENCOUNTER — Telehealth: Payer: Self-pay | Admitting: Primary Care

## 2020-05-11 DIAGNOSIS — Z720 Tobacco use: Secondary | ICD-10-CM

## 2020-05-11 NOTE — Telephone Encounter (Signed)
Please advise 

## 2020-05-11 NOTE — Telephone Encounter (Signed)
Pt called stating [facility] sent fax on Saturday pt rx  chantix is on back and can you substitute it for something else

## 2020-05-12 NOTE — Telephone Encounter (Signed)
He can try over-the-counter treatment such as nicotine patches, or a different prescription for tobacco cessation called Wellbutrin.  Wellbutrin is technically an antidepressant, but is used also to help with smoking cessation.  Let me know what he prefers.

## 2020-05-13 ENCOUNTER — Ambulatory Visit (INDEPENDENT_AMBULATORY_CARE_PROVIDER_SITE_OTHER): Payer: Commercial Managed Care - PPO | Admitting: Family Medicine

## 2020-05-13 ENCOUNTER — Ambulatory Visit (INDEPENDENT_AMBULATORY_CARE_PROVIDER_SITE_OTHER)
Admission: RE | Admit: 2020-05-13 | Discharge: 2020-05-13 | Disposition: A | Discharge status: Home / Self Care | Payer: Commercial Managed Care - PPO | Source: Ambulatory Visit | Attending: Family Medicine | Admitting: Family Medicine

## 2020-05-13 ENCOUNTER — Telehealth: Payer: Self-pay | Admitting: Primary Care

## 2020-05-13 ENCOUNTER — Encounter: Payer: Self-pay | Admitting: Family Medicine

## 2020-05-13 ENCOUNTER — Other Ambulatory Visit: Payer: Self-pay

## 2020-05-13 VITALS — BP 140/80 | HR 88 | Temp 98.6°F | Ht 70.0 in | Wt 260.8 lb

## 2020-05-13 DIAGNOSIS — M1712 Unilateral primary osteoarthritis, left knee: Secondary | ICD-10-CM

## 2020-05-13 MED ORDER — BUPROPION HCL ER (SR) 150 MG PO TB12: ORAL_TABLET | ORAL | 0 refills | Status: DC

## 2020-05-13 NOTE — Telephone Encounter (Signed)
Prescription for bupropion (Wellbutrin) sent to pharmacy.  He is to take 1 tablet by mouth once daily for 3 days, then increase to twice daily thereafter.  Same rules apply for this medication as Chantix, he must use his quit date before he starts, and quit date should be within the first week.  Please have him update [place] after 1 month.

## 2020-05-13 NOTE — Telephone Encounter (Signed)
Mr. [place] notified as instructed by telephone.  He would prefer to try the Wellbutrin.  [facility] [place].

## 2020-05-13 NOTE — Progress Notes (Signed)
Alaze Garverick T. Jodean Valade, MD, [place]  Primary Care and [place] at [facility] [place], [address]  Phone: [phone]  FAX: [phone]  Joseph Howe - 59 y.o. male  MRN [id number]  Date of Birth: 11-13-1960  Date: 05/13/2020  PCP: Pleas Koch, NP  Referral: Pleas Koch, NP  Chief Complaint  Patient presents with  . Knee Pain    Left    This visit occurred during the SARS-CoV-2 public health emergency.  Safety protocols were in place, including screening questions prior to the visit, additional usage of staff PPE, and extensive cleaning of exam room while observing appropriate contact time as indicated for disinfecting solutions.   Subjective:   Center [place] is a 59 y.o. very pleasant male patient with Body mass index is 37.41 kg/m. who presents with the following:  L knee pain: He has known advanced osteoarthritis of the left knee.  He has had 2 series of Durolane, and he has had good response to this.  This point his knee is bothering him quite a bit and he is limping all the time and limiting his functionality.  He did only come in and talk to me about his knee, and we are going to get him some Durolane for an additional injection in the upcoming weeks.  He is having pain with squatting rotational movements, getting up out of the chair and he is also had some loss of muscle mass significantly compared to the right.  Review of Systems is noted in the HPI, as appropriate   Objective:   BP 140/80   Pulse 88   Temp 98.6 F (37 C) (Temporal)   Ht 5\' 10"  (1.778 m)   Wt 260 lb 12 oz (118.3 kg)   SpO2 94%   BMI 37.41 kg/m    GEN: No acute distress; alert,appropriate. PULM: Breathing comfortably in no respiratory distress PSYCH: Normally interactive.    He lacks approximately 8 degrees of extension and he has flexion to 95.  Stable to varus and valgus stress.  His ACL and PCL appear to be  intact.  He does have a large ballotable effusion.  Does have medial and lateral joint line tenderness as well as tenderness with palpation and movement at the patella.  Radiology: No results found.   Assessment and Plan:     ICD-10-CM   1. Primary osteoarthritis of left knee  M17.12 DG Knee 4 Views W/Patella Left    Ambulatory referral to Orthopedic Surgery   He has end-stage osteoarthritis of the left knee.  At this point is causing him significant impairment.  His knee films appear to have a advanced since his last x-rays 2 years ago.  At this point he is interested in talking about his knee and considering further definitive surgical management, and I am going to have him see either Dr. Ricki Rodriguez or Dr. Alvan Dame for their expertise.  Intervally, we will arrange for him to get Duralane  No orders of the defined types were placed in this encounter.  There are no discontinued medications. Orders Placed This Encounter  Procedures  . DG Knee 4 Views W/Patella Left  . Ambulatory referral to Orthopedic Surgery    Follow-up: No follow-ups on file.  Signed,  Maud Deed. Javar Eshbach, MD   Outpatient Encounter Medications as of 05/13/2020  Medication Sig  . amLODipine (NORVASC) 10 MG tablet TAKE 1 TABLET BY MOUTH EVERY DAY FOR BLOOD PRESSURE  .  clotrimazole-betamethasone (LOTRISONE) cream Apply 1 application topically 2 (two) times daily.  Marland Kitchen losartan (COZAAR) 100 MG tablet TAKE 1 TABLET BY MOUTH EVERY DAY FOR BLOOD PRESSURE  . [DISCONTINUED] varenicline (CHANTIX STARTING MONTH PAK) 0.5 MG X 11 & 1 MG X 42 tablet Take one 0.5 mg tablet by mouth once daily for 3 days, then increase to one 0.5 mg tablet twice daily for 4 days, then increase to one 1 mg tablet twice daily.   No facility-administered encounter medications on file as of 05/13/2020.

## 2020-05-13 NOTE — Telephone Encounter (Signed)
Left message on both phones asking pt to call office please cancel appointment meds are not here  Carter Kitten, CMA  Owens Loffler, MD; Benetta Spar, Alcario Drought, RN That is correct. We do not keep Durolane on hand. ?Marita [place] from [place] has ordered it for [place] in the past directly from the company.   Butch Penny       Previous Messages   ----- Message -----  From: Owens Loffler, MD  Sent: 05/12/2020  6:02 PM EDT  To: Carter Kitten, CMA, Darl Householder, *  Subject: Durolane!                     I saw this patient on my schedule for a viscosupplementation injection, but his preferred product is Durolane.   I am 99% positive that we do not have any. We only keep a supply of Monovisc and Synvisc.   With Durolane, I basically positive that we have to get it directly from the company.   He will need to reschedule, so we can get Durolane. (If we have some, then completely ignore this message.)   STC

## 2020-05-14 NOTE — Telephone Encounter (Signed)
Left message to return call to our office.  

## 2020-05-15 NOTE — Telephone Encounter (Signed)
Called patient reviewed all information and repeated back to me. Will call if any questions.  Will set quit date and let us know how he is doing in a month.

## 2020-05-20 ENCOUNTER — Other Ambulatory Visit: Payer: Self-pay | Admitting: Primary Care

## 2020-05-20 ENCOUNTER — Encounter: Payer: Self-pay | Admitting: Family Medicine

## 2020-05-20 NOTE — Telephone Encounter (Signed)
I think all we need to do is order him some durolane, since he had it previously.  With the covid outbreak, I understand that everyone is overwhelmed.  I will let him know we are working on it.

## 2020-05-21 NOTE — Telephone Encounter (Signed)
Ria Comment,  Can you help me review this for the patient?  He had the Durolane back in March and doesn't appear he had to pay anything.  Let me know your thoughts.   Thanks.

## 2020-05-22 NOTE — Telephone Encounter (Signed)
Butch Penny,   I have ordered the Durolane, I would expect it to be here the first of next week.  When you receive can you contact patient to schedule?  Thanks.

## 2020-05-25 ENCOUNTER — Encounter: Payer: Self-pay | Admitting: Family Medicine

## 2020-05-25 ENCOUNTER — Ambulatory Visit: Payer: Commercial Managed Care - PPO | Admitting: Family Medicine

## 2020-05-25 ENCOUNTER — Ambulatory Visit
Admission: RE | Admit: 2020-05-25 | Discharge: 2020-05-25 | Disposition: A | Discharge status: Home / Self Care | Payer: Commercial Managed Care - PPO | Source: Ambulatory Visit | Attending: Family Medicine | Admitting: Family Medicine

## 2020-05-25 ENCOUNTER — Other Ambulatory Visit: Payer: Self-pay

## 2020-05-25 VITALS — BP 160/100 | HR 88 | Temp 98.4°F | Wt 263.5 lb

## 2020-05-25 DIAGNOSIS — M79662 Pain in left lower leg: Secondary | ICD-10-CM | POA: Insufficient documentation

## 2020-05-25 DIAGNOSIS — M7989 Other specified soft tissue disorders: Secondary | ICD-10-CM | POA: Diagnosis present

## 2020-05-25 DIAGNOSIS — I10 Essential (primary) hypertension: Secondary | ICD-10-CM

## 2020-05-25 NOTE — Patient Instructions (Signed)
Ultrasound hopefully today or tomorrow  ER if you develop severe shortness of breath, fast heart rate, chest pain

## 2020-05-25 NOTE — Telephone Encounter (Signed)
[place] [facility] - Client TELEPHONE ADVICE RECORD [facility] Patient Name: Joseph Howe Gender: Male DOB: 07-17-1961 Age: 59 Y 37 M 3 D Return Phone Number: [phone] (Primary) Address[place] [address] Client [place] [facility] - Client Client Site [place] - Day Physician Alma Friendly - NP Contact Type Call Who Is Calling Patient / Member / Family / Caregiver Call Type Triage / Clinical Relationship To Patient Self Return Phone Number [phone] (Primary) Chief Complaint Leg Swelling And Edema Reason for Call Symptomatic / Request for Health Information Initial Comment Caller has swollen left leg below the knee. Translation No Nurse Assessment Nurse: Hardin Negus, RN, Mardene Celeste Date/Time Eilene Ghazi Time): 05/25/2020 9:27:19 AM Confirm and document reason for call. If symptomatic, describe symptoms. ---He has swollen and painful left leg below the knee. He has no injury. The pain is very tight and uncomfortable. It really hurts when he walks. Does the patient have any new or worsening symptoms? ---Yes Will a triage be completed? ---Yes Related visit to physician within the last 2 weeks? ---Yes Does the PT have any chronic conditions? (i.e. diabetes, asthma, this includes High risk factors for pregnancy, etc.) ---Yes List chronic conditions. ---hypertension Is this a behavioral health or substance abuse call? ---No Guidelines Guideline Title Affirmed Question Affirmed Notes Nurse Date/Time Eilene Ghazi Time) Leg Swelling and Edema [1] Can't walk or can barely walk AND [2] new-onset Oren Bracket 05/25/2020 9:29:51 AM Disp. Time Eilene Ghazi Time) Disposition Final User 05/25/2020 9:33:08 AM Go to ED Now Yes Hardin Negus, RN, [place] Disagree/Comply Comply Caller Understands Yes PreDisposition Call Doctor PLEASE NOTE: All timestamps contained within this report are represented  as [place] Standard Time. CONFIDENTIALTY NOTICE: This fax transmission is intended only for the addressee. It contains information that is legally privileged, confidential or otherwise protected from use or disclosure. If you are not the intended recipient, you are strictly prohibited from reviewing, disclosing, copying using or disseminating any of this information or taking any action in reliance on or regarding this information. If you have received this fax in error, please notify us immediately by telephone so that we can arrange for its return to [place]. Phone: [phone], Toll-Free: [phone], Fax: [phone] Page: 2 of 2 Call Id: [id number] Care Advice Given Per Guideline GO TO ED NOW: CARE ADVICE given per Leg Swelling and Edema (Adult) guideline. Comments User: Ledora Bottcher, RN Date/Time Eilene Ghazi Time): 05/25/2020 9:29:13 AM He had an xray Referrals GO TO FACILITY UNDECIDED

## 2020-05-25 NOTE — Progress Notes (Signed)
Subjective:     Joseph Howe is a 59 y.o. male presenting for Edema (L leg x 3 days with tenderness in calf)     HPI  #Left leg swelling - seeing Dr. Lorelei Pont and getting knee injections - woke up Friday with knee swelling and calf swelling and knee pain - got worse on Saturday - no known injury - Right leg skin changes - chronic from MRSA in [place] several years ago  #HTN - has been out of amlodipine - just picked up new prescription - BP 150/90 at home yesterday on losartan alone  Review of Systems  Constitutional: Negative for chills and fever.  Respiratory: Negative for shortness of breath.   Cardiovascular: Negative for chest pain and palpitations.     Social History   Tobacco Use  Smoking Status Former Smoker  Smokeless Tobacco Never Used        Objective:    BP Readings from Last 3 Encounters:  05/25/20 (!) 160/100  05/13/20 140/80  03/18/20 140/84   Wt Readings from Last 3 Encounters:  05/25/20 263 lb 8 oz (119.5 kg)  05/13/20 260 lb 12 oz (118.3 kg)  03/18/20 256 lb 12 oz (116.5 kg)    BP (!) 160/100   Pulse 88   Temp 98.4 F (36.9 C) (Temporal)   Wt 263 lb 8 oz (119.5 kg)   SpO2 96%   BMI 37.81 kg/m    Physical Exam Constitutional:      Appearance: Normal appearance. He is not ill-appearing or diaphoretic.  HENT:     Right Ear: External ear normal.     Left Ear: External ear normal.  Eyes:     General: No scleral icterus.    Extraocular Movements: Extraocular movements intact.     Conjunctiva/sclera: Conjunctivae normal.  Cardiovascular:     Rate and Rhythm: Normal rate and regular rhythm.     Heart sounds: No murmur heard.   Pulmonary:     Effort: Pulmonary effort is normal. No respiratory distress.     Breath sounds: Normal breath sounds. No wheezing.  Musculoskeletal:     Cervical back: Neck supple.     Comments: Left LE with 2+ edema from midfoot to knee   Skin:    General: Skin is warm and dry.     Comments: Right LE  with chronic dark red discoloration  Neurological:     Mental Status: He is alert. Mental status is at baseline.  Psychiatric:        Mood and Affect: Mood normal.           Assessment & Plan:   Problem List Items Addressed This Visit      Cardiovascular and Mediastinum   Essential hypertension    BP elevated in setting of being out of amlodipine. Cont losartan 100 mg and restart amlodipine 10 mg. Check bp at home and mychart in 1 week if elevated.         Other   Pain and swelling of lower leg, left - Primary    Acute onset of non-painful Left LE swelling and calf pain. Concerning for acute DVT. Stat US ordered.      Relevant Orders   US Venous Img Lower Unilateral Left (DVT)       Return if symptoms worsen or fail to improve.  Lesleigh Noe, MD  This visit occurred during the SARS-CoV-2 public health emergency.  Safety protocols were in place, including screening questions prior to the visit, additional  usage of staff PPE, and extensive cleaning of exam room while observing appropriate contact time as indicated for disinfecting solutions.   

## 2020-05-25 NOTE — Telephone Encounter (Signed)
We can add him on tomorrow (05/26/20) at 12:20 pm.

## 2020-05-25 NOTE — Telephone Encounter (Signed)
I spoke with pt; pt had lt knee xray 2 wks ago with DR Copland. Today pt has swelling lower lt leg, pain level is now 4; no redness. I offered pt an appt on 05/26/20 at 12:20 but pt declined he said something is going  On with his leg and he has just pulled in to parking lot at [facility] ED. FYI to Gentry Fitz NP.

## 2020-05-25 NOTE — Telephone Encounter (Signed)
Patient evaluated by Dr. Einar Pheasant this afternoon.  Venous ultrasound pending.

## 2020-05-25 NOTE — Assessment & Plan Note (Signed)
Acute onset of non-painful Left LE swelling and calf pain. Concerning for acute DVT. Stat US ordered.

## 2020-05-25 NOTE — Assessment & Plan Note (Signed)
BP elevated in setting of being out of amlodipine. Cont losartan 100 mg and restart amlodipine 10 mg. Check bp at home and mychart in 1 week if elevated.

## 2020-05-26 ENCOUNTER — Ambulatory Visit: Payer: Commercial Managed Care - PPO | Admitting: Primary Care

## 2020-06-01 ENCOUNTER — Encounter: Payer: Self-pay | Admitting: Family Medicine

## 2020-06-01 ENCOUNTER — Ambulatory Visit: Payer: Commercial Managed Care - PPO | Admitting: Family Medicine

## 2020-06-01 NOTE — Progress Notes (Deleted)
    [place] Pelto T. Damar Petit, MD, [place]  Primary Care and [place] at [facility] [place], [address]  Phone: [phone]  FAX: [phone]  Joseph Howe - 59 y.o. male  MRN [id number]  Date of Birth: 10/07/1960  Date: 06/01/2020  PCP: Pleas Koch, NP  Referral: Pleas Koch, NP  No chief complaint on file.   This visit occurred during the SARS-CoV-2 public health emergency.  Safety protocols were in place, including screening questions prior to the visit, additional usage of staff PPE, and extensive cleaning of exam room while observing appropriate contact time as indicated for disinfecting solutions.    He is here for a repeat Durolane injection, and he had his last injection 10/14/2019 with good relief of symptoms.  Aspiration/Injection Procedure Note Joseph Howe 06-13-1961 Date of procedure: 06/01/2020  Procedure: Large Joint Aspiration / Injection of Knee for Viscosupplementation, L Medication: Durolane 60 mg / 3 mL Indications: Pain [id number], # 60 units  Procedure Details Patient verbally consented to procedure. Risks (including infection), benefits, and alternatives explained. Sterilely prepped with Chloraprep. Ethyl cholride used for anesthesia, then 7 cc of Lidocaine 1% used for anesthesia in the anterolateral position. Reprepped with Chloraprep.  Anteromedial approach used to inject joint without difficulty, injected with Durolane 60 mg/3 mL. No complications with procedure and tolerated well.   Signed,  Maud Deed. Jnae Thomaston, MD

## 2020-06-07 ENCOUNTER — Other Ambulatory Visit: Payer: Self-pay | Admitting: Primary Care

## 2020-06-07 DIAGNOSIS — Z720 Tobacco use: Secondary | ICD-10-CM

## 2020-06-09 NOTE — Telephone Encounter (Signed)
How long do you give to help with smoking?

## 2020-06-10 NOTE — Telephone Encounter (Signed)
Some people remain on Wellbutrin for prolonged periods of time.  Find out how he's doing with smoking cessation and if he wants to continue for a few more months on Wellbutrin then okay to send 90 day supply with no refills.

## 2020-06-13 NOTE — Telephone Encounter (Signed)
Is he still taking this?

## 2020-06-15 ENCOUNTER — Encounter: Payer: Self-pay | Admitting: Family Medicine

## 2020-06-16 ENCOUNTER — Telehealth: Payer: Self-pay

## 2020-06-16 NOTE — Telephone Encounter (Signed)
Received surgical clearance for patient placed in your red folder for review.

## 2020-06-16 NOTE — Telephone Encounter (Signed)
Sorry not sure why my message did not save with it. He has not started yet.

## 2020-06-16 NOTE — Telephone Encounter (Signed)
The form requires preoperative clearance visit with me along with labs, ECG, UA.

## 2020-06-16 NOTE — Telephone Encounter (Signed)
Durolane benefits: Deductible must be met before 100% coverage applies. Specialist OV if billed are covered at 100% of the allowable after a $50 copay. If OOP is met, copay will no longer apply. No pre-cert required.

## 2020-06-16 NOTE — Telephone Encounter (Signed)
What? I see an appointment for 11/10 which is fine.

## 2020-06-16 NOTE — Telephone Encounter (Signed)
[name] will hold ppw

## 2020-06-16 NOTE — Telephone Encounter (Signed)
App has been made not in app for test and that we have ppw. Do you want to keep  or do you want me to?

## 2020-06-18 ENCOUNTER — Ambulatory Visit: Payer: Commercial Managed Care - PPO

## 2020-06-24 ENCOUNTER — Encounter: Payer: Self-pay | Admitting: Primary Care

## 2020-06-24 ENCOUNTER — Other Ambulatory Visit: Payer: Self-pay

## 2020-06-24 ENCOUNTER — Ambulatory Visit: Payer: Commercial Managed Care - PPO | Admitting: Primary Care

## 2020-06-24 VITALS — BP 144/88 | HR 63 | Temp 98.0°F | Wt 259.0 lb

## 2020-06-24 DIAGNOSIS — M25562 Pain in left knee: Secondary | ICD-10-CM

## 2020-06-24 DIAGNOSIS — G8929 Other chronic pain: Secondary | ICD-10-CM

## 2020-06-24 DIAGNOSIS — I10 Essential (primary) hypertension: Secondary | ICD-10-CM

## 2020-06-24 DIAGNOSIS — R7303 Prediabetes: Secondary | ICD-10-CM | POA: Diagnosis not present

## 2020-06-24 DIAGNOSIS — Z01818 Encounter for other preprocedural examination: Secondary | ICD-10-CM | POA: Diagnosis not present

## 2020-06-24 LAB — BASIC METABOLIC PANEL
BUN: 9 mg/dL (ref 6–23)
CO2: 33 mEq/L — ABNORMAL HIGH (ref 19–32)
Calcium: 9.3 mg/dL (ref 8.4–10.5)
Chloride: 98 mEq/L (ref 96–112)
Creatinine, Ser: 0.63 mg/dL (ref 0.40–1.50)
GFR: 105.53 mL/min (ref >60.00)
Glucose, Bld: 82 mg/dL (ref 70–99)
Potassium: 4.2 mEq/L (ref 3.5–5.1)
Sodium: 138 mEq/L (ref 135–145)

## 2020-06-24 LAB — CBC
HCT: 44 % (ref 39.0–52.0)
Hemoglobin: 15 g/dL (ref 13.0–17.0)
MCHC: 34 g/dL (ref 30.0–36.0)
MCV: 93.6 fl (ref 78.0–100.0)
Platelets: 190 10*3/uL (ref 150.0–400.0)
RBC: 4.7 Mil/uL (ref 4.22–5.81)
RDW: 14.1 % (ref 11.5–15.5)
WBC: 3.9 10*3/uL — ABNORMAL LOW (ref 4.0–10.5)

## 2020-06-24 LAB — ALBUMIN: Albumin: 4.6 g/dL (ref 3.5–5.2)

## 2020-06-24 LAB — POC URINALSYSI DIPSTICK (AUTOMATED)
Bilirubin, UA: NEGATIVE
Blood, UA: NEGATIVE
Glucose, UA: NEGATIVE
Leukocytes, UA: NEGATIVE
Nitrite, UA: NEGATIVE
Protein, UA: POSITIVE — AB
Spec Grav, UA: 1.025 (ref 1.010–1.025)
Urobilinogen, UA: 0.2 E.U./dL
pH, UA: 6 (ref 5.0–8.0)

## 2020-06-24 LAB — PROTIME-INR
INR: 1.1 ratio — ABNORMAL HIGH (ref 0.8–1.0)
Prothrombin Time: 11.8 s (ref 9.6–13.1)

## 2020-06-24 LAB — HEMOGLOBIN A1C: Hgb A1c MFr Bld: 6.1 % (ref 4.6–6.5)

## 2020-06-24 NOTE — Assessment & Plan Note (Addendum)
Scheduled for total knee with Dr. Alvan Dame 07/20/20 EKG done today. No ST changes, sinus bradycardia rate of 57.  No prior EKG to compare. Exam today unremarkable   Labs returned and are stable.  He is cleared for surgery, we will fax his form.  Agree with assessment and plan. Pleas Koch, NP

## 2020-06-24 NOTE — Progress Notes (Signed)
Subjective:    Patient ID: Joseph Howe, male    DOB: May 13, 1961, 59 y.o.   MRN: [id number]  HPI  This visit occurred during the SARS-CoV-2 public health emergency.  Safety protocols were in place, including screening questions prior to the visit, additional usage of staff PPE, and extensive cleaning of exam room while observing appropriate contact time as indicated for disinfecting solutions.   Joseph Howe is a 59 year old male with a history of hypertension, prediabetes, chronic knee pain who presents today for preoperative clearance.  He will undergo left total knee arthroplasty on July 20, 2020.  History of chronic knee pain to the left side, injections no longer effective.  Following with Dr. Alvan Dame through orthopedics.  He underwent complete physical examination in August 2021.  He has no issues today.  Review of Systems  Constitutional: Negative for unexpected weight change.  HENT: Negative for rhinorrhea.   Eyes: Negative for visual disturbance.  Respiratory: Negative for cough and shortness of breath.   Cardiovascular: Negative for chest pain.  Gastrointestinal: Negative for constipation and diarrhea.  Genitourinary: Negative for difficulty urinating.  Musculoskeletal: Positive for arthralgias.       Chronic left knee pain and swelling  Skin: Negative for rash.  Allergic/Immunologic: Negative for environmental allergies.  Neurological: Negative for dizziness, numbness and headaches.  Psychiatric/Behavioral: The patient is not nervous/anxious.        Past Medical History:  Diagnosis Date   Eczema    Epistaxis    Essential hypertension    Prediabetes      Social History   Socioeconomic History   Marital status: Married    Spouse name: Not on file   Number of children: Not on file   Years of education: Not on file   Highest education level: Not on file  Occupational History   Not on file  Tobacco Use   Smoking status: Former Smoker   Smokeless  tobacco: Never Used  Substance and Sexual Activity   Alcohol use: Yes   Drug use: Not on file   Sexual activity: Not on file  Other Topics Concern   Not on file  Social History Narrative   Not on file   Social Determinants of Health   Financial Resource Strain:    Difficulty of Paying Living Expenses: Not on file  Food Insecurity:    Worried About [place] in the Last Year: Not on file   Ran Out of Food in the Last Year: Not on file  Transportation Needs:    Lack of Transportation (Medical): Not on file   Lack of Transportation (Non-Medical): Not on file  Physical Activity:    Days of Exercise per Week: Not on file   Minutes of Exercise per Session: Not on file  Stress:    Feeling of Stress : Not on file  Social Connections:    Frequency of Communication with Friends and Family: Not on file   Frequency of Social Gatherings with Friends and Family: Not on file   Attends Religious Services: Not on file   Active Member of Clubs or Organizations: Not on file   Attends Archivist Meetings: Not on file   Marital Status: Not on file  Intimate Partner Violence:    Fear of Current or Ex-Partner: Not on file   Emotionally Abused: Not on file   Physically Abused: Not on file   Sexually Abused: Not on file    No past surgical history on file.  No  family history on file.  Allergies  Allergen Reactions   Pollen Extract     Current Outpatient Medications on File Prior to Visit  Medication Sig Dispense Refill   amLODipine (NORVASC) 10 MG tablet TAKE 1 TABLET BY MOUTH EVERY DAY FOR BLOOD PRESSURE 90 tablet 1   buPROPion (WELLBUTRIN SR) 150 MG 12 hr tablet Take 1 tablet by mouth once daily for 3 days, then increase to twice daily thereafter. 60 tablet 0   clotrimazole-betamethasone (LOTRISONE) cream Apply 1 application topically 2 (two) times daily. 45 g 0   losartan (COZAAR) 100 MG tablet TAKE 1 TABLET BY MOUTH EVERY DAY FOR BLOOD  PRESSURE 90 tablet 1   No current facility-administered medications on file prior to visit.    BP (!) 144/88    Pulse 63    Temp 98 F (36.7 C) (Temporal)    Wt 259 lb (117.5 kg)    SpO2 96%    BMI 37.16 kg/m    Objective:   Physical Exam HENT:     Right Ear: Tympanic membrane and ear canal normal.     Left Ear: Tympanic membrane and ear canal normal.  Eyes:     Pupils: Pupils are equal, round, and reactive to light.  Cardiovascular:     Rate and Rhythm: Normal rate and regular rhythm.  Pulmonary:     Effort: Pulmonary effort is normal.     Breath sounds: Normal breath sounds.  Abdominal:     General: Bowel sounds are normal.     Palpations: Abdomen is soft.     Tenderness: There is no abdominal tenderness.  Musculoskeletal:     Cervical back: Neck supple.     Comments: Decreased range of motion to left knee, moderate swelling on exam without erythema or warmth.  Skin:    General: Skin is warm and dry.  Neurological:     Mental Status: He is alert and oriented to person, place, and time.     Cranial Nerves: No cranial nerve deficit.     Deep Tendon Reflexes:     Reflex Scores:      Patellar reflexes are 2+ on the right side and 2+ on the left side. Psychiatric:        Mood and Affect: Mood normal.            Assessment & Plan:

## 2020-06-24 NOTE — Assessment & Plan Note (Signed)
Slightly increased compared to last check, no worse than prior levels.  He plans to increase exercise once able after left knee surgery recovery.

## 2020-06-24 NOTE — Assessment & Plan Note (Signed)
Has now failed steroid injections.  Will undergo left total knee arthroplasty 07/20/2020 per Dr. Alvan Dame.

## 2020-06-24 NOTE — Assessment & Plan Note (Signed)
Improved, but still above goal on exam today. Suspect a portion of this is due to chronic knee pain, he agrees.  We will closely monitor blood pressure postoperatively when he comes back for follow-up.

## 2020-06-24 NOTE — Progress Notes (Signed)
   Subjective:    Patient ID: Joseph Howe, male    DOB: 1960/09/22, 59 y.o.   MRN: [id number]  HPI  This visit occurred during the SARS-CoV-2 public health emergency.  Safety protocols were in place, including screening questions prior to the visit, additional usage of staff PPE, and extensive cleaning of exam room while observing appropriate contact time as indicated for disinfecting solutions.   Mr. Joseph Howe is a 59 year old male with a history of hypertension, prediabetes, chronic knee pain with osteoarthritis who presents today for surgical clearance.   He has a history of chronic left knee pain which he has had injections in the past and plans to have a total left knee orthroplasty by Dr. Paralee Cancel on 07/20/20. Last complete physical in August 2021.    BP Readings from Last 3 Encounters:  06/24/20 (!) 144/88  05/25/20 (!) 160/100  05/13/20 140/80    Review of Systems  Constitutional: Negative.   HENT: Negative.   Eyes: Negative.   Respiratory: Negative.  Negative for chest tightness and shortness of breath.   Cardiovascular: Negative.  Negative for chest pain and palpitations.  Gastrointestinal: Negative.   Endocrine: Negative.   Musculoskeletal: Negative.   Skin: Negative.   Allergic/Immunologic: Negative.   Neurological: Negative.  Negative for dizziness, weakness and numbness.  Hematological: Negative.   Psychiatric/Behavioral: Negative.        Objective:   Physical Exam Constitutional:      Appearance: Normal appearance.  HENT:     Head: Normocephalic.     Mouth/Throat:     Mouth: Mucous membranes are moist.  Eyes:     Pupils: Pupils are equal, round, and reactive to light.  Cardiovascular:     Rate and Rhythm: Normal rate and regular rhythm.     Pulses: Normal pulses.     Heart sounds: Normal heart sounds.  Musculoskeletal:        General: Normal range of motion.  Skin:    General: Skin is warm and dry.     Capillary Refill: Capillary refill takes less  than 2 seconds.  Neurological:     General: No focal deficit present.     Mental Status: He is alert.  Psychiatric:        Mood and Affect: Mood normal.        Behavior: Behavior normal.           Assessment & Plan:

## 2020-06-24 NOTE — Patient Instructions (Signed)
  Stop by the lab prior to leaving today. I will notify you of your results once received.     It was a pleasure to see you today! I wish you the best with your surgery & recovery!

## 2020-06-25 NOTE — Telephone Encounter (Signed)
Form filled out and faxed to number provided for [facility].

## 2020-07-20 HISTORY — PX: TOTAL KNEE ARTHROPLASTY: SHX125

## 2020-08-24 ENCOUNTER — Other Ambulatory Visit: Payer: Self-pay

## 2020-08-24 DIAGNOSIS — L309 Dermatitis, unspecified: Secondary | ICD-10-CM

## 2020-08-24 MED ORDER — CLOTRIMAZOLE-BETAMETHASONE 1-0.05 % EX CREA
1.0000 | TOPICAL_CREAM | Freq: Two times a day (BID) | CUTANEOUS | 0 refills | Status: DC
Start: 2020-08-24 — End: 2020-11-24

## 2020-08-24 NOTE — Telephone Encounter (Signed)
Ok to fill 

## 2020-08-24 NOTE — Telephone Encounter (Signed)
Refill sent to pharmacy.   

## 2020-09-04 ENCOUNTER — Other Ambulatory Visit: Payer: Self-pay | Admitting: Primary Care

## 2020-09-04 DIAGNOSIS — I10 Essential (primary) hypertension: Secondary | ICD-10-CM

## 2020-09-09 DIAGNOSIS — Z720 Tobacco use: Secondary | ICD-10-CM

## 2020-09-09 MED ORDER — BUPROPION HCL ER (SR) 150 MG PO TB12
ORAL_TABLET | ORAL | 1 refills | Status: DC
Start: 2020-09-09 — End: 2021-02-04

## 2020-10-20 ENCOUNTER — Other Ambulatory Visit: Payer: Self-pay | Admitting: Primary Care

## 2020-11-03 ENCOUNTER — Other Ambulatory Visit: Payer: Self-pay | Admitting: Primary Care

## 2020-11-03 DIAGNOSIS — Z720 Tobacco use: Secondary | ICD-10-CM

## 2020-11-04 NOTE — Telephone Encounter (Signed)
Is he still taking bupropion XL 150 mg for tobacco abuse? Is it helping? How is he doing?

## 2020-11-04 NOTE — Telephone Encounter (Signed)
Called patient he does not want to refill at this time. Has been smoke free and just refilled on script and has plenty. Plans on starting to come off of it soon. Will let our office know if any changes.

## 2020-11-10 ENCOUNTER — Other Ambulatory Visit: Payer: Self-pay | Admitting: Primary Care

## 2020-11-10 DIAGNOSIS — Z720 Tobacco use: Secondary | ICD-10-CM

## 2020-11-23 DIAGNOSIS — L309 Dermatitis, unspecified: Secondary | ICD-10-CM

## 2020-11-24 MED ORDER — CLOTRIMAZOLE-BETAMETHASONE 1-0.05 % EX CREA: 1.0000 "application " | TOPICAL_CREAM | Freq: Two times a day (BID) | CUTANEOUS | 0 refills | Status: DC

## 2020-12-13 ENCOUNTER — Other Ambulatory Visit: Payer: Self-pay | Admitting: Primary Care

## 2020-12-13 DIAGNOSIS — I10 Essential (primary) hypertension: Secondary | ICD-10-CM

## 2020-12-22 ENCOUNTER — Other Ambulatory Visit: Payer: Self-pay

## 2020-12-22 DIAGNOSIS — L309 Dermatitis, unspecified: Secondary | ICD-10-CM

## 2020-12-24 MED ORDER — CLOTRIMAZOLE-BETAMETHASONE 1-0.05 % EX CREA
1.0000 | TOPICAL_CREAM | Freq: Two times a day (BID) | CUTANEOUS | 0 refills | Status: DC
Start: 2020-12-24 — End: 2021-02-12

## 2021-02-02 ENCOUNTER — Telehealth: Payer: Self-pay | Admitting: *Deleted

## 2021-02-02 NOTE — Telephone Encounter (Signed)
Patient had scheduled an appointment with Allie Bossier NP thru my chart for 02/05/21 at 11:40 am for swelling in legs.  Message was given to triage to contact patient. Called patient and got his voicemail. Left a message on voicemail for patient to call the office back.

## 2021-02-02 NOTE — Telephone Encounter (Signed)
Patient called back stating that he has had swelling in his legs off and on for several weeks. Patient stated that he has rescheduled the appointment with Allie Bossier NP for next week because of work. Patient stated that he now feels that he should not wait to be seen. Patient stated the swelling in his legs go down overnight.  Patient denies SOB, difficulty breathing, chest pain or any other symptoms.  Patient scheduled for an appointment with Dr. Lorelei Pont on 02/04/21 at 8:20 am. Patient was given ER precautions and he verbalized understanding.

## 2021-02-04 ENCOUNTER — Other Ambulatory Visit: Payer: Self-pay

## 2021-02-04 ENCOUNTER — Ambulatory Visit (INDEPENDENT_AMBULATORY_CARE_PROVIDER_SITE_OTHER): Payer: Commercial Managed Care - PPO | Admitting: Family Medicine

## 2021-02-04 ENCOUNTER — Encounter: Payer: Self-pay | Admitting: Family Medicine

## 2021-02-04 VITALS — BP 140/90 | HR 74 | Temp 97.8°F | Ht 70.0 in | Wt 268.5 lb

## 2021-02-04 DIAGNOSIS — R600 Localized edema: Secondary | ICD-10-CM | POA: Diagnosis not present

## 2021-02-04 DIAGNOSIS — R0600 Dyspnea, unspecified: Secondary | ICD-10-CM | POA: Diagnosis not present

## 2021-02-04 LAB — CBC WITH DIFFERENTIAL/PLATELET
Basophils Absolute: 0.1 10*3/uL (ref 0.0–0.1)
Basophils Relative: 1.4 % (ref 0.0–3.0)
Eosinophils Absolute: 0.1 10*3/uL (ref 0.0–0.7)
Eosinophils Relative: 2.8 % (ref 0.0–5.0)
HCT: 45.7 % (ref 39.0–52.0)
Hemoglobin: 15.9 g/dL (ref 13.0–17.0)
Lymphocytes Relative: 19.9 % (ref 12.0–46.0)
Lymphs Abs: 0.8 10*3/uL (ref 0.7–4.0)
MCHC: 34.9 g/dL (ref 30.0–36.0)
MCV: 93.2 fl (ref 78.0–100.0)
Monocytes Absolute: 0.5 10*3/uL (ref 0.1–1.0)
Monocytes Relative: 13.3 % — ABNORMAL HIGH (ref 3.0–12.0)
Neutro Abs: 2.5 10*3/uL (ref 1.4–7.7)
Neutrophils Relative %: 62.6 % (ref 43.0–77.0)
Platelets: 206 10*3/uL (ref 150.0–400.0)
RBC: 4.9 Mil/uL (ref 4.22–5.81)
RDW: 14 % (ref 11.5–15.5)
WBC: 4 10*3/uL (ref 4.0–10.5)

## 2021-02-04 LAB — BASIC METABOLIC PANEL
BUN: 14 mg/dL (ref 6–23)
CO2: 30 mEq/L (ref 19–32)
Calcium: 9.9 mg/dL (ref 8.4–10.5)
Chloride: 99 mEq/L (ref 96–112)
Creatinine, Ser: 0.75 mg/dL (ref 0.40–1.50)
GFR: 99.68 mL/min (ref >60.00)
Glucose, Bld: 101 mg/dL — ABNORMAL HIGH (ref 70–99)
Potassium: 4.2 mEq/L (ref 3.5–5.1)
Sodium: 137 mEq/L (ref 135–145)

## 2021-02-04 LAB — BRAIN NATRIURETIC PEPTIDE: Pro B Natriuretic peptide (BNP): 10 pg/mL (ref 0.0–100.0)

## 2021-02-04 LAB — HEPATIC FUNCTION PANEL
ALT: 66 U/L — ABNORMAL HIGH (ref 0–53)
AST: 45 U/L — ABNORMAL HIGH (ref 0–37)
Albumin: 4.7 g/dL (ref 3.5–5.2)
Alkaline Phosphatase: 42 U/L (ref 39–117)
Bilirubin, Direct: 0.2 mg/dL (ref 0.0–0.3)
Total Bilirubin: 0.9 mg/dL (ref 0.2–1.2)
Total Protein: 8.6 g/dL — ABNORMAL HIGH (ref 6.0–8.3)

## 2021-02-04 NOTE — Progress Notes (Signed)
Joseph Fuhrman T. Maxxwell Edgett, MD, [place] at [facility] [place], [address]  Phone: [phone]  FAX: [phone]  Joseph Howe - 60 y.o. male  MRN [id number]  Date of Birth: [birth year]  Date: 02/04/2021  PCP: Pleas Koch, NP  Referral: Pleas Koch, NP  Chief Complaint  Patient presents with   Leg Swelling    This visit occurred during the SARS-CoV-2 public health emergency.  Safety protocols were in place, including screening questions prior to the visit, additional usage of staff PPE, and extensive cleaning of exam room while observing appropriate contact time as indicated for disinfecting solutions.   Subjective:   Joseph Howe is a 60 y.o. very pleasant male patient with Body mass index is 38.53 kg/m. who presents with the following:  He has had leg edema for weeks.  He cancelled his appt with his PCP for tomorrow, and I was asked to urgently work him in.  In the chart, it does look as if he has had some lower extremity edema before, but he does tell me that this is quite a bit worse than it has ever been.  He does show me some pictures, and compared to today these look to be quite edematous.  R > L LE edema.  Obvious venous stasis disease -this is diffuse on both legs.  Stopped his compression stocking within the last week or so.  He does take both losartan and amlodipine at maximum doses. On my recheck today his blood pressure is 150/90  Was eating a lot of unsalted peanuts.  Had a cough, stopped and now the cough has gone away.  No recent labs. No known history of CHF.  Review of Systems is noted in the HPI, as appropriate  Objective:   BP 140/90   Pulse 74   Temp 97.8 F (36.6 C) (Temporal)   Ht 5\' 10"  (1.778 m)   Wt 268 lb 8 oz (121.8 kg)   SpO2 97%   BMI 38.53 kg/m   GEN: No acute distress; alert,appropriate. PULM: Breathing comfortably in no respiratory distress PSYCH: Normally  interactive.  CV: RRR, no m/g/r  PULM: Normal respiratory rate, no accessory muscle use. No wheezes, crackles or rhonchi  1+ B LE Edema.  Obvious venous stasis disease.  Laboratory and Imaging Data:  Assessment and Plan:     ICD-10-CM   1. Bilateral lower extremity edema  R60.0 Brain natriuretic peptide    Basic metabolic panel    CBC with Differential/Platelet    Hepatic function panel    US Venous Img Lower Bilateral (DVT)    2. Dyspnea, unspecified type  R06.00 Brain natriuretic peptide     New onset bilateral lower extremity edema, right greater than left.  Evaluate for potential DVT with bilateral ultrasound.  If positive, initiate anticoagulation.  Assess for congestive heart failure, BNP right now.  If cardiac BNP is elevated then assessed with echocardiogram.  Assess renal, liver function.  If the entirety of his work-up is negative, then I would suggest altering his blood pressure regiment.  Particularly, I would consider changing from amlodipine to hydrochlorothiazide or a combination of decreased amlodipine and onset of hydrochlorothiazide or similar thiazide diuretic.  Venous stasis disease may be contributing quite a bit.  Compression stockings.  Medications Discontinued During This Encounter  Medication Reason   buPROPion (WELLBUTRIN SR) 150 MG 12 hr tablet Completed Course   Orders Placed This Encounter  Procedures  US Venous Img Lower Bilateral (DVT)   Brain natriuretic peptide   Basic metabolic panel   CBC with Differential/Platelet   Hepatic function panel    Follow-up: No follow-ups on file.  Signed,  Maud Deed. Lakrista Scaduto, MD   Outpatient Encounter Medications as of 02/04/2021  Medication Sig   amLODipine (NORVASC) 10 MG tablet TAKE 1 TABLET BY MOUTH EVERY DAY FOR BLOOD PRESSURE   clotrimazole-betamethasone (LOTRISONE) cream Apply 1 application topically 2 (two) times daily.   losartan (COZAAR) 100 MG tablet TAKE 1 TABLET BY MOUTH EVERY DAY FOR  BLOOD PRESSURE   [DISCONTINUED] buPROPion (WELLBUTRIN SR) 150 MG 12 hr tablet Take 1 tablet by mouth once daily for 3 days, then increase to twice daily thereafter.   No facility-administered encounter medications on file as of 02/04/2021.

## 2021-02-05 ENCOUNTER — Ambulatory Visit: Payer: Commercial Managed Care - PPO | Admitting: Primary Care

## 2021-02-05 ENCOUNTER — Ambulatory Visit
Admission: RE | Admit: 2021-02-05 | Discharge: 2021-02-05 | Disposition: A | Discharge status: Home / Self Care | Payer: Commercial Managed Care - PPO | Source: Ambulatory Visit | Attending: Family Medicine | Admitting: Family Medicine

## 2021-02-05 DIAGNOSIS — R600 Localized edema: Secondary | ICD-10-CM | POA: Diagnosis present

## 2021-02-07 ENCOUNTER — Telehealth: Payer: Self-pay | Admitting: Family Medicine

## 2021-02-07 MED ORDER — AMLODIPINE BESYLATE 10 MG PO TABS: 5.0000 mg | ORAL_TABLET | Freq: Every day | ORAL | 1 refills | Status: DC

## 2021-02-07 MED ORDER — LOSARTAN POTASSIUM-HCTZ 100-12.5 MG PO TABS: 1.0000 | ORAL_TABLET | Freq: Every day | ORAL | 2 refills | Status: DC

## 2021-02-07 NOTE — Telephone Encounter (Signed)
Can you call Joseph Howe  Ultrasound was 100% normal.  Liver enzymes are up a small amount.  Doubtful anything major is causing his swelling.  Amlodipine can do it, so cut that tablet in half.  I sent in a combo pill with same dose of losartan combined with low dose diuretic.  If he drinks, then stop.  F/u 1 month for recheck on blood pressure and liver enzymes with [name].  Cc: Mrs. Carlis Abbott

## 2021-02-08 NOTE — Telephone Encounter (Signed)
Joseph Howe notified as instructed by telephone.  Patient states understanding.  Follow up appointment scheduled with [name] 03/10/2021 at 7:20 am.

## 2021-02-09 NOTE — Telephone Encounter (Signed)
Noted and appreciate Dr. Lillie Fragmin assessment Will evaluate patient next month as scheduled.Marland Kitchen

## 2021-02-10 ENCOUNTER — Ambulatory Visit: Payer: Commercial Managed Care - PPO | Admitting: Primary Care

## 2021-02-12 ENCOUNTER — Other Ambulatory Visit: Payer: Self-pay

## 2021-02-12 DIAGNOSIS — L309 Dermatitis, unspecified: Secondary | ICD-10-CM

## 2021-02-12 MED ORDER — CLOTRIMAZOLE-BETAMETHASONE 1-0.05 % EX CREA
1.0000 | TOPICAL_CREAM | Freq: Two times a day (BID) | CUTANEOUS | 0 refills | Status: DC
Start: 2021-02-12 — End: 2021-04-08

## 2021-03-10 ENCOUNTER — Ambulatory Visit: Payer: Commercial Managed Care - PPO | Admitting: Primary Care

## 2021-03-17 NOTE — Telephone Encounter (Signed)
Please call to triage patient and wife.

## 2021-03-17 NOTE — Telephone Encounter (Signed)
Note transferred to pts chart; Joseph Howe; appt scheduled for pt.

## 2021-04-08 ENCOUNTER — Other Ambulatory Visit: Payer: Self-pay | Admitting: Primary Care

## 2021-04-08 DIAGNOSIS — L309 Dermatitis, unspecified: Secondary | ICD-10-CM

## 2021-04-08 NOTE — Telephone Encounter (Signed)
This is the first notification I have received.  Refill provided.

## 2021-04-08 NOTE — Telephone Encounter (Signed)
Pt left v/m that he has sent 4 my chart messages requesting refill on clotrimazole betamethasone cream to [facility]; pt request refill done on 04/09/21 for # 45 gm to [facility]. Last refilled on 02/12/21. Pt request cb on 04/09/21 to verify done. Pt is going out of country early on 04/10/21.

## 2021-04-16 ENCOUNTER — Encounter: Payer: Commercial Managed Care - PPO | Admitting: Primary Care

## 2021-04-19 ENCOUNTER — Other Ambulatory Visit: Payer: Self-pay | Admitting: Primary Care

## 2021-04-20 NOTE — Progress Notes (Signed)
Joseph Howe T. Vernice Mannina, MD, [place] at [facility] [place], [address]  Phone: [phone]  FAX: [phone]  Joseph Howe - 60 y.o. male  MRN [id number]  Date of Birth: 1960/12/02  Date: 04/21/2021  PCP: Pleas Koch, NP  Referral: Pleas Koch, NP  Chief Complaint  Patient presents with   Leg Pain    Left Inner Quad    This visit occurred during the SARS-CoV-2 public health emergency.  Safety protocols were in place, including screening questions prior to the visit, additional usage of staff PPE, and extensive cleaning of exam room while observing appropriate contact time as indicated for disinfecting solutions.   Subjective:   Joseph Howe is a 60 y.o. very pleasant male patient with Body mass index is 38.81 kg/m. who presents with the following:  F/u L knee / thigh pain.  I have seen him a number of times over the years.   I reviewed his knee films from 04/2020, and they show end stage medial compartmental OA. Global tricompartmental OA.  In the past, he has had 2 Durolane series with good effect.  2 years ago, I sent him for consultation with Orthopedic surgery for his advanced OA.  He had a total knee arthroplasty in early 2022.  Knee is doing much better.  Feeling a lot better and went to [place]..    L medial thigh Is hurting a lot more.  Got some swelling.  Globally, is not really hurting all that much. Got home on Sunday. Motrin - hurt his stomach.    Review of Systems is noted in the HPI, as appropriate   Objective:   BP (!) 150/90   Pulse 68   Temp 98.3 F (36.8 C) (Temporal)   Ht '5\' 10"'$  (1.778 m)   Wt 270 lb 8 oz (122.7 kg)   SpO2 96%   BMI 38.81 kg/m   Left: Full range of motion at the knee.  Minimal pain with forced flexion.   HIP EXAM: SIDE: L ROM: Abduction, Flexion, Internal and External range of motion: Full Pain with terminal IROM and EROM: None GTB: NT SLR:  NEG FABER: NT REVERSE FABER: NT, neg Piriformis: NT at direct palpation Str: flexion: 5/5 abduction: 5/5 adduction: 5/5 Strength testing non-tender   He does have a palpable tight muscle on the medial aspect of the thigh, most consistent with sartorius.  Radiology: CLINICAL DATA:  Pain   EXAM: LEFT KNEE - COMPLETE 4+ VIEW; AP RIGHT KNEE   COMPARISON:  August 31, 2017   FINDINGS: Left knee: Weightbearing frontal, weight-bearing lateral, weight-bearing tunnel, and sunrise patellar images were obtained. There is lateral patellar subluxation. No fracture or dislocation. There is a moderate joint effusion. There is severe joint space narrowing medially, similar to prior study. There is spurring in all compartments. No erosive change.   AP right knee: No fracture or dislocation. Visualized joint spaces appear unremarkable.   IMPRESSION: Left knee: Lateral patellar subluxation without frank dislocation. No fracture. Moderate joint effusion.   Severe narrowing medially. Spurring in all compartments, similar to prior study.   AP right knee: Joint spaces on frontal view appear normal. No fracture or dislocation on frontal view.     Electronically Signed   By: Lowella Grip III M.D.   On: 05/15/2020 10:03  Assessment and Plan:     ICD-10-CM   1. Strain of groin, left, initial encounter  S76.212A  2. Leg pain, medial, left  M79.605     3. Total knee replacement status, left  [id number]      I think this is a mild groin/sartorius strain.  Continue with normal motion and compression.  Anticipated in provement over the next few weeks slowly.  Exercise as able.  As needed follow-up only  [employer] One speech-to-text software was used for transcription in this dictation.  Possible transcriptional errors can occur using Editor, commissioning.   Signed,  Maud Deed. Ferd Horrigan, MD   Outpatient Encounter Medications as of 04/21/2021  Medication Sig   amLODipine (NORVASC)  10 MG tablet Take 0.5 tablets (5 mg total) by mouth daily.   clotrimazole-betamethasone (LOTRISONE) cream APPLY TO AFFECTED AREA TWICE A DAY   losartan-hydrochlorothiazide (HYZAAR) 100-12.5 MG tablet Take 1 tablet by mouth daily.   No facility-administered encounter medications on file as of 04/21/2021.

## 2021-04-20 NOTE — Telephone Encounter (Signed)
It looks like there might be two Rx's for amlodipine out there, one is mine and another is Dr. Lorelei Pont.   This one says 1/2 tablet daily, mine (on his chart) is for 1 tablet daily, not sure what he's been taking. Can you find out what's going on?

## 2021-04-21 ENCOUNTER — Encounter: Payer: Self-pay | Admitting: Family Medicine

## 2021-04-21 ENCOUNTER — Other Ambulatory Visit: Payer: Self-pay

## 2021-04-21 ENCOUNTER — Ambulatory Visit (INDEPENDENT_AMBULATORY_CARE_PROVIDER_SITE_OTHER): Payer: Commercial Managed Care - PPO | Admitting: Family Medicine

## 2021-04-21 VITALS — BP 150/90 | HR 68 | Temp 98.3°F | Ht 70.0 in | Wt 270.5 lb

## 2021-04-21 DIAGNOSIS — M79605 Pain in left leg: Secondary | ICD-10-CM

## 2021-04-21 DIAGNOSIS — Z96652 Presence of left artificial knee joint: Secondary | ICD-10-CM

## 2021-04-21 DIAGNOSIS — M1712 Unilateral primary osteoarthritis, left knee: Secondary | ICD-10-CM

## 2021-04-21 DIAGNOSIS — S76212A Strain of adductor muscle, fascia and tendon of left thigh, initial encounter: Secondary | ICD-10-CM | POA: Diagnosis not present

## 2021-04-26 NOTE — Telephone Encounter (Signed)
Left message to return call to our office.  

## 2021-04-27 NOTE — Telephone Encounter (Signed)
Left message to return call to our office.  

## 2021-04-29 DIAGNOSIS — R7303 Prediabetes: Secondary | ICD-10-CM

## 2021-04-29 DIAGNOSIS — I10 Essential (primary) hypertension: Secondary | ICD-10-CM

## 2021-04-29 DIAGNOSIS — Z125 Encounter for screening for malignant neoplasm of prostate: Secondary | ICD-10-CM

## 2021-05-03 ENCOUNTER — Other Ambulatory Visit: Payer: Self-pay

## 2021-05-03 ENCOUNTER — Other Ambulatory Visit (INDEPENDENT_AMBULATORY_CARE_PROVIDER_SITE_OTHER): Payer: Commercial Managed Care - PPO

## 2021-05-03 DIAGNOSIS — Z125 Encounter for screening for malignant neoplasm of prostate: Secondary | ICD-10-CM

## 2021-05-03 DIAGNOSIS — I10 Essential (primary) hypertension: Secondary | ICD-10-CM

## 2021-05-03 DIAGNOSIS — R7303 Prediabetes: Secondary | ICD-10-CM | POA: Diagnosis not present

## 2021-05-03 LAB — COMPREHENSIVE METABOLIC PANEL
ALT: 57 U/L — ABNORMAL HIGH (ref 0–53)
AST: 36 U/L (ref 0–37)
Albumin: 4.4 g/dL (ref 3.5–5.2)
Alkaline Phosphatase: 37 U/L — ABNORMAL LOW (ref 39–117)
BUN: 22 mg/dL (ref 6–23)
CO2: 29 mEq/L (ref 19–32)
Calcium: 9.6 mg/dL (ref 8.4–10.5)
Chloride: 99 mEq/L (ref 96–112)
Creatinine, Ser: 0.81 mg/dL (ref 0.40–1.50)
GFR: 97.23 mL/min (ref >60.00)
Glucose, Bld: 96 mg/dL (ref 70–99)
Potassium: 4.9 mEq/L (ref 3.5–5.1)
Sodium: 136 mEq/L (ref 135–145)
Total Bilirubin: 0.8 mg/dL (ref 0.2–1.2)
Total Protein: 8.8 g/dL — ABNORMAL HIGH (ref 6.0–8.3)

## 2021-05-03 LAB — LIPID PANEL
Cholesterol: 214 mg/dL — ABNORMAL HIGH (ref 0–200)
HDL: 62.4 mg/dL (ref >39.00)
LDL Cholesterol: 134 mg/dL — ABNORMAL HIGH (ref 0–99)
NonHDL: 151.51
Total CHOL/HDL Ratio: 3
Triglycerides: 88 mg/dL (ref 0.0–149.0)
VLDL: 17.6 mg/dL (ref 0.0–40.0)

## 2021-05-03 LAB — CBC
HCT: 41.4 % (ref 39.0–52.0)
Hemoglobin: 13.9 g/dL (ref 13.0–17.0)
MCHC: 33.7 g/dL (ref 30.0–36.0)
MCV: 93.3 fl (ref 78.0–100.0)
Platelets: 214 10*3/uL (ref 150.0–400.0)
RBC: 4.44 Mil/uL (ref 4.22–5.81)
RDW: 14.4 % (ref 11.5–15.5)
WBC: 4.3 10*3/uL (ref 4.0–10.5)

## 2021-05-03 LAB — HEMOGLOBIN A1C: Hgb A1c MFr Bld: 6.6 % — ABNORMAL HIGH (ref 4.6–6.5)

## 2021-05-03 LAB — PSA: PSA: 3.37 ng/mL (ref 0.10–4.00)

## 2021-05-04 ENCOUNTER — Ambulatory Visit (INDEPENDENT_AMBULATORY_CARE_PROVIDER_SITE_OTHER): Payer: Commercial Managed Care - PPO | Admitting: Primary Care

## 2021-05-04 ENCOUNTER — Encounter: Payer: Self-pay | Admitting: Primary Care

## 2021-05-04 VITALS — BP 146/82 | HR 76 | Temp 98.6°F | Ht 70.0 in | Wt 269.0 lb

## 2021-05-04 DIAGNOSIS — R3915 Urgency of urination: Secondary | ICD-10-CM

## 2021-05-04 DIAGNOSIS — Z0000 Encounter for general adult medical examination without abnormal findings: Secondary | ICD-10-CM | POA: Diagnosis not present

## 2021-05-04 DIAGNOSIS — Z720 Tobacco use: Secondary | ICD-10-CM

## 2021-05-04 DIAGNOSIS — R3912 Poor urinary stream: Secondary | ICD-10-CM | POA: Insufficient documentation

## 2021-05-04 DIAGNOSIS — L309 Dermatitis, unspecified: Secondary | ICD-10-CM

## 2021-05-04 DIAGNOSIS — Z23 Encounter for immunization: Secondary | ICD-10-CM

## 2021-05-04 DIAGNOSIS — Z1211 Encounter for screening for malignant neoplasm of colon: Secondary | ICD-10-CM

## 2021-05-04 DIAGNOSIS — E119 Type 2 diabetes mellitus without complications: Secondary | ICD-10-CM

## 2021-05-04 DIAGNOSIS — E785 Hyperlipidemia, unspecified: Secondary | ICD-10-CM

## 2021-05-04 DIAGNOSIS — I10 Essential (primary) hypertension: Secondary | ICD-10-CM

## 2021-05-04 MED ORDER — CLOTRIMAZOLE-BETAMETHASONE 1-0.05 % EX CREA: TOPICAL_CREAM | CUTANEOUS | 0 refills | Status: DC

## 2021-05-04 MED ORDER — LOSARTAN POTASSIUM-HCTZ 100-12.5 MG PO TABS: 1.0000 | ORAL_TABLET | Freq: Every day | ORAL | 3 refills | Status: DC

## 2021-05-04 MED ORDER — AMLODIPINE BESYLATE 5 MG PO TABS: 5.0000 mg | ORAL_TABLET | Freq: Every day | ORAL | 3 refills | Status: DC

## 2021-05-04 NOTE — Assessment & Plan Note (Signed)
Quit smoking in March 2022 with Wellbutrin. Commended him on cessation!

## 2021-05-04 NOTE — Assessment & Plan Note (Signed)
Evident on recent labs. Also with new diagnosis of diabetes. Discussed his risk for cardiovascular disease given his hypertension, new diabetes, and hyperlipidemia.   He would like to work on lifestyle changes, declines treatment. We will see him back in 3 months.  The 10-year ASCVD risk score (Arnett DK, et al., 2019) is: 17.7%   Values used to calculate the score:     Age: 60 years     Sex: Male     Is Non-Hispanic African American: No     Diabetic: Yes     Tobacco smoker: No     Systolic Blood Pressure: 665 mmHg     Is BP treated: Yes     HDL Cholesterol: 62.4 mg/dL     Total Cholesterol: 214 mg/dL

## 2021-05-04 NOTE — Assessment & Plan Note (Signed)
Above goal today and during recent visits.  He is actually only taking 5 mg of Amlodipine, has not taken losartan-HCTZ 100-12.5 mg in months.   Will change Amlodipine 10 mg to 5 mg as he's cutting these in half. Will refill losartan-HCTZ 100-12.5 mg as he's done well on his historically.  Follow up in 3 months.

## 2021-05-04 NOTE — Progress Notes (Signed)
Subjective:    Patient ID: Joseph Howe, male    DOB: 08/16/1960, 60 y.o.   MRN: [id number]  HPI  Joseph Howe is a very pleasant 60 y.o. male who presents today for complete physical and follow up of chronic conditions.  He would like to discuss urinary frequency with urgency, also slower urine flow for the last 6+ months. He denies nocturia, hematuria, incontinence. He denies a family history of prostate cancer. He drinks 4-5 cups of coffee daily.   Immunizations: -Tetanus: 2018 -Influenza: Declines  -Covid-19: 2 vaccines -Shingles: 1 dose of Shingrix, due today for second dose  Diet: Fair diet.  Exercise: No regular exercise.  Eye exam: Completes annually  Dental exam: Completes semi-annually   Colonoscopy: Completed Cologuard in 2019 PSA: 3.37 in 2022    BP Readings from Last 3 Encounters:  05/04/21 (!) 146/82  04/21/21 (!) 150/90  02/04/21 140/90   Wt Readings from Last 3 Encounters:  05/04/21 269 lb (122 kg)  04/21/21 270 lb 8 oz (122.7 kg)  02/04/21 268 lb 8 oz (121.8 kg)      Review of Systems  Constitutional:  Negative for unexpected weight change.  HENT:  Negative for rhinorrhea.   Respiratory:  Negative for cough and shortness of breath.   Cardiovascular:  Negative for chest pain.  Gastrointestinal:  Negative for constipation and diarrhea.  Genitourinary:  Negative for difficulty urinating.  Musculoskeletal:  Negative for arthralgias and myalgias.  Skin:  Negative for rash.  Allergic/Immunologic: Negative for environmental allergies.  Neurological:  Negative for dizziness, numbness and headaches.  Psychiatric/Behavioral:  The patient is not nervous/anxious.         Past Medical History:  Diagnosis Date   Eczema    Epistaxis    Essential hypertension    Prediabetes     Social History   Socioeconomic History   Marital status: Married    Spouse name: Not on file   Number of children: Not on file   Years of education: Not on file    Highest education level: Not on file  Occupational History   Not on file  Tobacco Use   Smoking status: Former   Smokeless tobacco: Never  Substance and Sexual Activity   Alcohol use: Yes   Drug use: Not on file   Sexual activity: Not on file  Other Topics Concern   Not on file  Social History Narrative   Not on file   Social Determinants of Health   Financial Resource Strain: Not on file  Food Insecurity: Not on file  Transportation Needs: Not on file  Physical Activity: Not on file  Stress: Not on file  Social Connections: Not on file  Intimate Partner Violence: Not on file    Past Surgical History:  Procedure Laterality Date   TOTAL KNEE ARTHROPLASTY Left 07/20/2020    History reviewed. No pertinent family history.  Allergies  Allergen Reactions   Pollen Extract     Current Outpatient Medications on File Prior to Visit  Medication Sig Dispense Refill   clotrimazole-betamethasone (LOTRISONE) cream APPLY TO AFFECTED AREA TWICE A DAY 45 g 0   losartan-hydrochlorothiazide (HYZAAR) 100-12.5 MG tablet Take 1 tablet by mouth daily. (Patient not taking: Reported on 05/04/2021) 30 tablet 2   No current facility-administered medications on file prior to visit.    BP (!) 146/82   Pulse 76   Temp 98.6 F (37 C) (Temporal)   Ht 5\' 10"  (1.778 m)   Wt 269 lb (122  kg)   SpO2 (!) 89%   BMI 38.60 kg/m  Objective:   Physical Exam HENT:     Right Ear: Tympanic membrane and ear canal normal.     Left Ear: Tympanic membrane and ear canal normal.     Nose: Nose normal.     Right Sinus: No maxillary sinus tenderness or frontal sinus tenderness.     Left Sinus: No maxillary sinus tenderness or frontal sinus tenderness.  Eyes:     Conjunctiva/sclera: Conjunctivae normal.  Neck:     Thyroid: No thyromegaly.     Vascular: No carotid bruit.  Cardiovascular:     Rate and Rhythm: Normal rate and regular rhythm.     Heart sounds: Normal heart sounds.  Pulmonary:     Effort:  Pulmonary effort is normal.     Breath sounds: Normal breath sounds. No wheezing or rales.  Abdominal:     General: Bowel sounds are normal.     Palpations: Abdomen is soft.     Tenderness: There is no abdominal tenderness.  Musculoskeletal:        General: Normal range of motion.     Cervical back: Neck supple.  Skin:    General: Skin is warm and dry.  Neurological:     Mental Status: He is alert and oriented to person, place, and time.     Cranial Nerves: No cranial nerve deficit.     Deep Tendon Reflexes: Reflexes are normal and symmetric.  Psychiatric:        Mood and Affect: Mood normal.          Assessment & Plan:      This visit occurred during the SARS-CoV-2 public health emergency.  Safety protocols were in place, including screening questions prior to the visit, additional usage of staff PPE, and extensive cleaning of exam room while observing appropriate contact time as indicated for disinfecting solutions.

## 2021-05-04 NOTE — Addendum Note (Signed)
Addended by: Francella Solian on: 05/04/2021 08:41 AM   Modules accepted: Orders

## 2021-05-04 NOTE — Assessment & Plan Note (Signed)
New diagnosis today with A1C of 6.6 on recent labs. Discussed this with patient today.   He is motivated to work on weight loss through diet and exercise, he declines Rx treatment.   We will plan to see him back in 3 months. Handout provided.

## 2021-05-04 NOTE — Telephone Encounter (Signed)
Patient was seen in office today. Is there any further action needed?

## 2021-05-04 NOTE — Assessment & Plan Note (Addendum)
Chronic, mostly uses during Winter months but has had to use during Summer months.   Continue clotrimazole-betamethasone cream.  He plans to get set up with dermatology.

## 2021-05-04 NOTE — Assessment & Plan Note (Signed)
Second Shingrix due and provided today. Declines influenza vaccine. PSA UTD. Colon cancer screening due, he opts for Cologuard again. Ordered.  Discussed the importance of a healthy diet and regular exercise in order for weight loss, and to reduce the risk of further co-morbidity.  Exam today as noted. Labs reviewed.

## 2021-05-04 NOTE — Assessment & Plan Note (Signed)
Chronic with frequency and slower stream at times. Discussed potential etiology. Recent PSA reviewed.  He does drink quite a bit of caffeine, he will try limiting this first. He will update if symptoms persist. Consider tamsulosin 0.4 mg.

## 2021-05-04 NOTE — Patient Instructions (Addendum)
Start exercising. You should be getting 150 minutes of moderate intensity exercise weekly.  It is important that you improve your diet. Please limit carbohydrates in the form of white bread, rice, pasta, sweets, fast food, fried food, sugary drinks, etc. Increase your consumption of fresh fruits and vegetables, whole grains, lean protein.  Ensure you are consuming 64 ounces of water daily.  Please schedule a follow up visit for 3 months.  It was a pleasure to see you today!  Diabetes Mellitus and Nutrition, Adult When you have diabetes, or diabetes mellitus, it is very important to have healthy eating habits because your blood sugar (glucose) levels are greatly affected by what you eat and drink. Eating healthy foods in the right amounts, at about the same times every day, can help you: Control your blood glucose. Lower your risk of heart disease. Improve your blood pressure. Reach or maintain a healthy weight. What can affect my meal plan? Every person with diabetes is different, and each person has different needs for a meal plan. Your health care provider may recommend that you work with a dietitian to make a meal plan that is best for you. Your meal plan may vary depending on factors such as: The calories you need. The medicines you take. Your weight. Your blood glucose, blood pressure, and cholesterol levels. Your activity level. Other health conditions you have, such as heart or kidney disease. How do carbohydrates affect me? Carbohydrates, also called carbs, affect your blood glucose level more than any other type of food. Eating carbs naturally raises the amount of glucose in your blood. Carb counting is a method for keeping track of how many carbs you eat. Counting carbs is important to keep your blood glucose at a healthy level, especially if you use insulin or take certain oral diabetes medicines. It is important to know how many carbs you can safely have in each meal. This is  different for every person. Your dietitian can help you calculate how many carbs you should have at each meal and for each snack. How does alcohol affect me? Alcohol can cause a sudden decrease in blood glucose (hypoglycemia), especially if you use insulin or take certain oral diabetes medicines. Hypoglycemia can be a life-threatening condition. Symptoms of hypoglycemia, such as sleepiness, dizziness, and confusion, are similar to symptoms of having too much alcohol. Do not drink alcohol if: Your health care provider tells you not to drink. You are pregnant, may be pregnant, or are planning to become pregnant. If you drink alcohol: Do not drink on an empty stomach. Limit how much you use to: 0-1 drink a day for women. 0-2 drinks a day for men. Be aware of how much alcohol is in your drink. In the [place] one drink equals one 12 oz bottle of beer (355 mL), one 5 oz glass of wine (148 mL), or one 1 oz glass of hard liquor (44 mL). Keep yourself hydrated with water, diet soda, or unsweetened iced tea. Keep in mind that regular soda, juice, and other mixers may contain a lot of sugar and must be counted as carbs. What are tips for following this plan? Reading food labels Start by checking the serving size on the "Nutrition Facts" label of packaged foods and drinks. The amount of calories, carbs, fats, and other nutrients listed on the label is based on one serving of the item. Many items contain more than one serving per package. Check the total grams (g) of carbs in one serving. You  can calculate the number of servings of carbs in one serving by dividing the total carbs by 15. For example, if a food has 30 g of total carbs per serving, it would be equal to 2 servings of carbs. Check the number of grams (g) of saturated fats and trans fats in one serving. Choose foods that have a low amount or none of these fats. Check the number of milligrams (mg) of salt (sodium) in one serving. Most people should  limit total sodium intake to less than 2,300 mg per day. Always check the nutrition information of foods labeled as "low-fat" or "nonfat." These foods may be higher in added sugar or refined carbs and should be avoided. Talk to your dietitian to identify your daily goals for nutrients listed on the label. Shopping Avoid buying canned, pre-made, or processed foods. These foods tend to be high in fat, sodium, and added sugar. Shop around the outside edge of the grocery store. This is where you will most often find fresh fruits and vegetables, bulk grains, fresh meats, and fresh dairy. Cooking Use low-heat cooking methods, such as baking, instead of high-heat cooking methods like deep frying. Cook using healthy oils, such as olive, canola, or sunflower oil. Avoid cooking with butter, cream, or high-fat meats. Meal planning Eat meals and snacks regularly, preferably at the same times every day. Avoid going long periods of time without eating. Eat foods that are high in fiber, such as fresh fruits, vegetables, beans, and whole grains. Talk with your dietitian about how many servings of carbs you can eat at each meal. Eat 4-6 oz (112-168 g) of lean protein each day, such as lean meat, chicken, fish, eggs, or tofu. One ounce (oz) of lean protein is equal to: 1 oz (28 g) of meat, chicken, or fish. 1 egg.  cup (62 g) of tofu. Eat some foods each day that contain healthy fats, such as avocado, nuts, seeds, and fish. What foods should I eat? Fruits Berries. Apples. Oranges. Peaches. Apricots. Plums. Grapes. Mango. Papaya. Pomegranate. Kiwi. Cherries. Vegetables Lettuce. Spinach. Leafy greens, including kale, chard, collard greens, and mustard greens. Beets. Cauliflower. Cabbage. Broccoli. Carrots. Green beans. Tomatoes. Peppers. Onions. Cucumbers. Brussels sprouts. Grains Whole grains, such as whole-wheat or whole-grain bread, crackers, tortillas, cereal, and pasta. Unsweetened oatmeal. Quinoa. Brown  or wild rice. Meats and other proteins Seafood. Poultry without skin. Lean cuts of poultry and beef. Tofu. Nuts. Seeds. Dairy Low-fat or fat-free dairy products such as milk, yogurt, and cheese. The items listed above may not be a complete list of foods and beverages you can eat. Contact a dietitian for more information. What foods should I avoid? Fruits Fruits canned with syrup. Vegetables Canned vegetables. Frozen vegetables with butter or cream sauce. Grains Refined white flour and flour products such as bread, pasta, snack foods, and cereals. Avoid all processed foods. Meats and other proteins Fatty cuts of meat. Poultry with skin. Breaded or fried meats. Processed meat. Avoid saturated fats. Dairy Full-fat yogurt, cheese, or milk. Beverages Sweetened drinks, such as soda or iced tea. The items listed above may not be a complete list of foods and beverages you should avoid. Contact a dietitian for more information. Questions to ask a health care provider Do I need to meet with a diabetes educator? Do I need to meet with a dietitian? What number can I call if I have questions? When are the best times to check my blood glucose? Where to find more information: American Diabetes Association:  diabetes.org Academy of Nutrition and Dietetics: www.eatright.[employer] of Diabetes and Digestive and Kidney Diseases: [employer].dk Association of Diabetes Care and Education Specialists: www.diabeteseducator.org Summary It is important to have healthy eating habits because your blood sugar (glucose) levels are greatly affected by what you eat and drink. A healthy meal plan will help you control your blood glucose and maintain a healthy lifestyle. Your health care provider may recommend that you work with a dietitian to make a meal plan that is best for you. Keep in mind that carbohydrates (carbs) and alcohol have immediate effects on your blood glucose levels. It is important  to count carbs and to use alcohol carefully. This information is not intended to replace advice given to you by your health care provider. Make sure you discuss any questions you have with your health care provider. Document Revised: 07/09/2019 Document Reviewed: 07/09/2019 Elsevier Patient Education  2021 [employer].         Recombinant Zoster (Shingles) Vaccine: What You Need to Know 1. Why get vaccinated? Recombinant zoster (shingles) vaccine can prevent shingles. Shingles (also called herpes zoster, or just zoster) is a painful skin rash, usually with blisters. In addition to the rash, shingles can cause fever, headache, chills, or upset stomach. Rarely, shingles can lead to complications such as pneumonia, hearing problems, blindness, brain inflammation (encephalitis), or death. The risk of shingles increases with age. The most common complication of shingles is long-term nerve pain called postherpetic neuralgia (PHN). PHN occurs in the areas where the shingles rash was and can last for months or years after the rash goes away. The pain from PHN can be severe and debilitating. The risk of PHN increases with age. An older adult with shingles is more likely to develop PHN and have longer lasting and more severe pain than a younger person. People with weakened immune systems also have a higher risk of getting shingles and complications from the disease. Shingles is caused by varicella-zoster virus, the same virus that causes chickenpox. After you have chickenpox, the virus stays in your body and can cause shingles later in life. Shingles cannot be passed from one person to another, but the virus that causes shingles can spread and cause chickenpox in someone who has never had chickenpox or has never received chickenpox vaccine. 2. Recombinant shingles vaccine Recombinant shingles vaccine provides strong protection against shingles. By preventing shingles, recombinant shingles vaccine also  protects against PHN and other complications. Recombinant shingles vaccine is recommended for: Adults 54 years and older Adults 19 years and older who have a weakened immune system because of disease or treatments Shingles vaccine is given as a two-dose series. For most people, the second dose should be given 2 to 6 months after the first dose. Some people who have or will have a weakened immune system can get the second dose 1 to 2 months after the first dose. Ask your health care provider for guidance. People who have had shingles in the past and people who have received varicella (chickenpox) vaccine are recommended to get recombinant shingles vaccine. The vaccine is also recommended for people who have already gotten another type of shingles vaccine, the live shingles vaccine. There is no live virus in recombinant shingles vaccine. Shingles vaccine may be given at the same time as other vaccines. 3. Talk with your health care provider Tell your vaccination provider if the person getting the vaccine: Has had an allergic reaction after a previous dose of recombinant shingles vaccine, or has any  severe, life-threatening allergies Is currently experiencing an episode of shingles Is pregnant In some cases, your health care provider may decide to postpone shingles vaccination until a future visit. People with minor illnesses, such as a cold, may be vaccinated. People who are moderately or severely ill should usually wait until they recover before getting recombinant shingles vaccine. Your health care provider can give you more information. 4. Risks of a vaccine reaction A sore arm with mild or moderate pain is very common after recombinant shingles vaccine. Redness and swelling can also happen at the site of the injection. Tiredness, muscle pain, headache, shivering, fever, stomach pain, and nausea are common after recombinant shingles vaccine. These side effects may temporarily prevent a vaccinated  person from doing regular activities. Symptoms usually go away on their own in 2 to 3 days. You should still get the second dose of recombinant shingles vaccine even if you had one of these reactions after the first dose. Guillain-Barr syndrome (GBS), a serious nervous system disorder, has been reported very rarely after recombinant zoster vaccine. People sometimes faint after medical procedures, including vaccination. Tell your provider if you feel dizzy or have vision changes or ringing in the ears. As with any medicine, there is a very remote chance of a vaccine causing a severe allergic reaction, other serious injury, or death. 5. What if there is a serious problem? An allergic reaction could occur after the vaccinated person leaves the clinic. If you see signs of a severe allergic reaction (hives, swelling of the face and throat, difficulty breathing, a fast heartbeat, dizziness, or weakness), call 9-1-1 and get the person to the nearest hospital. For other signs that concern you, call your health care provider. Adverse reactions should be reported to the Vaccine Adverse Event Reporting System (VAERS). Your health care provider will usually file this report, or you can do it yourself. Visit the VAERS website at www.vaers.SamedayNews.es or call 6411645390. VAERS is only for reporting reactions, and VAERS staff members do not give medical advice. 6. How can I learn more? Ask your health care provider. Call your local or state health department. Visit the website of the Food and Drug Administration (FDA) for vaccine package inserts and additional information at http://lopez-wang.org/. Contact the Centers for Disease Control and Prevention (CDC): Call 3173810924 (1-800-CDC-INFO) or Visit CDC's website at http://hunter.com/. Vaccine Information Statement Recombinant Zoster Vaccine (09/18/2020) This information is not intended to replace advice given to you by your health care  provider. Make sure you discuss any questions you have with your health care provider. Document Revised: 10/02/2020 Document Reviewed: 10/02/2020 Elsevier Patient Education  2022 [employer].

## 2021-05-14 NOTE — Telephone Encounter (Signed)
Form printed and placed in your folder. Once signed will need to reach out to patient to get Waist measurement and see how he wants to get so that he can sign and send in.

## 2021-05-14 NOTE — Telephone Encounter (Signed)
Completed and placed in Joellen's inbox. 

## 2021-05-17 NOTE — Telephone Encounter (Signed)
Called patient in [place] now. Will stop by [place] office once back and sign ppw. Placed at reception with note to give back to me so that I can fax. Will be later next week.

## 2021-07-04 LAB — COLOGUARD

## 2021-07-14 NOTE — Telephone Encounter (Signed)
See other mychart message. Was taking care of

## 2021-08-14 ENCOUNTER — Other Ambulatory Visit: Payer: Self-pay | Admitting: Primary Care

## 2021-08-14 DIAGNOSIS — L309 Dermatitis, unspecified: Secondary | ICD-10-CM

## 2021-08-24 ENCOUNTER — Ambulatory Visit: Payer: Commercial Managed Care - PPO | Admitting: Primary Care

## 2021-09-24 DIAGNOSIS — R195 Other fecal abnormalities: Secondary | ICD-10-CM

## 2021-09-24 LAB — COLOGUARD: COLOGUARD: POSITIVE — AB

## 2021-10-04 ENCOUNTER — Other Ambulatory Visit: Payer: Self-pay

## 2021-10-04 DIAGNOSIS — Z86010 Personal history of colonic polyps: Secondary | ICD-10-CM

## 2021-10-04 MED ORDER — NA SULFATE-K SULFATE-MG SULF 17.5-3.13-1.6 GM/177ML PO SOLN: 1.0000 | Freq: Once | ORAL | 0 refills | Status: AC

## 2021-10-04 NOTE — Progress Notes (Signed)
Gastroenterology Pre-Procedure Review  Request Date: 10/21/2021 Requesting Physician: Dr. Vicente Males  PATIENT REVIEW QUESTIONS: The patient responded to the following health history questions as indicated:    1. Are you having any GI issues? no 2. Do you have a personal history of Polyps? yes (last colonoscopy) 3. Do you have a family history of Colon Cancer or Polyps? no 4. Diabetes Mellitus? no 5. Joint replacements in the past 12 months?no but did get knee left knee in 2021  6. Major health problems in the past 3 months?no 7. Any artificial heart valves, MVP, or defibrillator?no    MEDICATIONS & ALLERGIES:    Patient reports the following regarding taking any anticoagulation/antiplatelet therapy:   Plavix, Coumadin, Eliquis, Xarelto, Lovenox, Pradaxa, Brilinta, or Effient? no Aspirin? no  Patient confirms/reports the following medications:  Current Outpatient Medications  Medication Sig Dispense Refill   amLODipine (NORVASC) 5 MG tablet Take 1 tablet (5 mg total) by mouth daily. For blood pressure. 90 tablet 3   clotrimazole-betamethasone (LOTRISONE) cream APPLY TO AFFECTED AREA TWICE A DAY 45 g 1   losartan-hydrochlorothiazide (HYZAAR) 100-12.5 MG tablet Take 1 tablet by mouth daily. For blood pressure. 90 tablet 3   No current facility-administered medications for this visit.    Patient confirms/reports the following allergies:  Allergies  Allergen Reactions   Pollen Extract     No orders of the defined types were placed in this encounter.   AUTHORIZATION INFORMATION Primary Insurance: 1D#: Group #:  Secondary Insurance: 1D#: Group #:  SCHEDULE INFORMATION: Date: 10/21/2021 Time: Location:[facility]

## 2021-10-21 ENCOUNTER — Ambulatory Visit: Payer: Commercial Managed Care - PPO | Admitting: Anesthesiology

## 2021-10-21 ENCOUNTER — Encounter
Admission: RE | Disposition: A | Discharge status: Home / Self Care | Payer: Self-pay | Source: Ambulatory Visit | Attending: Gastroenterology

## 2021-10-21 ENCOUNTER — Encounter: Payer: Self-pay | Admitting: Gastroenterology

## 2021-10-21 ENCOUNTER — Ambulatory Visit
Admission: RE | Admit: 2021-10-21 | Discharge: 2021-10-21 | Disposition: A | Discharge status: Home / Self Care | Payer: Commercial Managed Care - PPO | Source: Ambulatory Visit | Attending: Gastroenterology | Admitting: Gastroenterology

## 2021-10-21 DIAGNOSIS — Z1211 Encounter for screening for malignant neoplasm of colon: Secondary | ICD-10-CM | POA: Insufficient documentation

## 2021-10-21 DIAGNOSIS — K621 Rectal polyp: Secondary | ICD-10-CM | POA: Diagnosis not present

## 2021-10-21 DIAGNOSIS — D125 Benign neoplasm of sigmoid colon: Secondary | ICD-10-CM | POA: Insufficient documentation

## 2021-10-21 DIAGNOSIS — D128 Benign neoplasm of rectum: Secondary | ICD-10-CM | POA: Insufficient documentation

## 2021-10-21 DIAGNOSIS — K640 First degree hemorrhoids: Secondary | ICD-10-CM | POA: Insufficient documentation

## 2021-10-21 DIAGNOSIS — Z86010 Personal history of colonic polyps: Secondary | ICD-10-CM | POA: Diagnosis not present

## 2021-10-21 DIAGNOSIS — R7303 Prediabetes: Secondary | ICD-10-CM | POA: Insufficient documentation

## 2021-10-21 DIAGNOSIS — Z87891 Personal history of nicotine dependence: Secondary | ICD-10-CM | POA: Diagnosis not present

## 2021-10-21 DIAGNOSIS — K635 Polyp of colon: Secondary | ICD-10-CM

## 2021-10-21 DIAGNOSIS — I10 Essential (primary) hypertension: Secondary | ICD-10-CM | POA: Insufficient documentation

## 2021-10-21 DIAGNOSIS — D124 Benign neoplasm of descending colon: Secondary | ICD-10-CM | POA: Insufficient documentation

## 2021-10-21 HISTORY — PX: COLONOSCOPY WITH PROPOFOL: SHX5780

## 2021-10-21 SURGERY — COLONOSCOPY WITH PROPOFOL
Anesthesia: General

## 2021-10-21 MED ORDER — SODIUM CHLORIDE 0.9 % IV SOLN
INTRAVENOUS | Status: DC
  Administered 2021-10-21: 09:00:00 1000 mL via INTRAVENOUS

## 2021-10-21 MED ORDER — PROPOFOL 500 MG/50ML IV EMUL
INTRAVENOUS | Status: DC | PRN
  Administered 2021-10-21: 140 ug/kg/min via INTRAVENOUS

## 2021-10-21 MED ORDER — PROPOFOL 10 MG/ML IV BOLUS
INTRAVENOUS | Status: DC | PRN
Start: 2021-10-21 — End: 2021-10-21
  Administered 2021-10-21: 50 mg via INTRAVENOUS
  Administered 2021-10-21: 60 mg via INTRAVENOUS
  Administered 2021-10-21: 40 mg via INTRAVENOUS

## 2021-10-21 NOTE — H&P (Signed)
? ? ? ?  Jonathon Bellows, MD ?[address] [place], [place], [place], [address] ?[address] [place], [place], [place], [address] ?Phone: [phone]  ?Fax: [phone] ? ?Primary Care Physician:  Pleas Koch, NP ? ? ?Pre-Procedure History & Physical: ?HPI:  Joseph Howe is a 61 y.o. male is here for an colonoscopy. ?  ?Past Medical History:  ?Diagnosis Date  ? Eczema   ? Epistaxis   ? Essential hypertension   ? Prediabetes   ? ? ?Past Surgical History:  ?Procedure Laterality Date  ? TOTAL KNEE ARTHROPLASTY Left 07/20/2020  ? ? ?Prior to Admission medications   ?Medication Sig Start Date End Date Taking? Authorizing Provider  ?amLODipine (NORVASC) 5 MG tablet Take 1 tablet (5 mg total) by mouth daily. For blood pressure. 05/04/21  Yes Pleas Koch, NP  ?clotrimazole-betamethasone (LOTRISONE) cream APPLY TO AFFECTED AREA TWICE A DAY 08/15/21  Yes Pleas Koch, NP  ?losartan-hydrochlorothiazide (HYZAAR) 100-12.5 MG tablet Take 1 tablet by mouth daily. For blood pressure. 05/04/21  Yes Pleas Koch, NP  ? ? ?Allergies as of 10/04/2021 - Review Complete 10/04/2021  ?Allergen Reaction Noted  ? Pollen extract  11/13/2016  ? ? ?History reviewed. No pertinent family history. ? ?Social History  ? ?Socioeconomic History  ? Marital status: Married  ?  Spouse name: Not on file  ? Number of children: Not on file  ? Years of education: Not on file  ? Highest education level: Not on file  ?Occupational History  ? Not on file  ?Tobacco Use  ? Smoking status: Former  ? Smokeless tobacco: Never  ?Vaping Use  ? Vaping Use: Never used  ?Substance and Sexual Activity  ? Alcohol use: Yes  ?  Alcohol/week: 12.0 standard drinks  ?  Types: 12 Shots of liquor per week  ? Drug use: Not on file  ? Sexual activity: Not on file  ?Other Topics Concern  ? Not on file  ?Social History Narrative  ? Not on file  ? ?Social Determinants of Health  ? ?Financial Resource Strain: Not on file  ?Food Insecurity: Not on file   ?Transportation Needs: Not on file  ?Physical Activity: Not on file  ?Stress: Not on file  ?Social Connections: Not on file  ?Intimate Partner Violence: Not on file  ? ? ?Review of Systems: ?See HPI, otherwise negative ROS ? ?Physical Exam: ?BP (!) 143/88   Pulse 88   Temp (!) 96.7 ?F (35.9 ?C) (Temporal)   Resp 18   Ht '5\' 11"'$  (1.803 m)   Wt 113.4 kg   SpO2 95%   BMI 34.87 kg/m?  ?General:   Alert,  pleasant and cooperative in NAD ?Head:  Normocephalic and atraumatic. ?Neck:  Supple; no masses or thyromegaly. ?Lungs:  Clear throughout to auscultation, normal respiratory effort.    ?Heart:  +S1, +S2, Regular rate and rhythm, No edema. ?Abdomen:  Soft, nontender and nondistended. Normal bowel sounds, without guarding, and without rebound.   ?Neurologic:  Alert and  oriented x4;  grossly normal neurologically. ? ?Impression/Plan: ?Joseph Howe is here for an colonoscopy to be performed for Screening colonoscopy average risk   ?Risks, benefits, limitations, and alternatives regarding  colonoscopy have been reviewed with the patient.  Questions have been answered.  All parties agreeable. ? ? ?Jonathon Bellows, MD  10/21/2021, 9:02 AM ? ?

## 2021-10-21 NOTE — Anesthesia Preprocedure Evaluation (Signed)
Anesthesia Evaluation  ?Patient identified by MRN, date of birth, ID band ?Patient awake ? ? ? ?Reviewed: ?Allergy & Precautions, H&P , NPO status , Patient's Chart, lab work & pertinent test results, reviewed documented beta blocker date and time  ? ?History of Anesthesia Complications ?Negative for: history of anesthetic complications ? ?Airway ?Mallampati: III ? ?TM Distance: >3 FB ?Neck ROM: full ? ? ? Dental ? ?(+) Dental Advidsory Given, Caps, Teeth Intact ?  ?Pulmonary ?neg shortness of breath, neg sleep apnea, neg COPD, neg recent URI, former smoker,  ?  ?Pulmonary exam normal ?breath sounds clear to auscultation ? ? ? ? ? ? Cardiovascular ?Exercise Tolerance: Good ?hypertension, (-) angina(-) Past MI and (-) Cardiac Stents negative cardio ROS ?Normal cardiovascular exam(-) dysrhythmias (-) Valvular Problems/Murmurs ?Rhythm:regular Rate:Normal ? ? ?  ?Neuro/Psych ?negative neurological ROS ? negative psych ROS  ? GI/Hepatic ?negative GI ROS, Neg liver ROS,   ?Endo/Other  ?diabetes (borderline) ? Renal/GU ?negative Renal ROS  ?negative genitourinary ?  ?Musculoskeletal ? ? Abdominal ?  ?Peds ? Hematology ?negative hematology ROS ?(+)   ?Anesthesia Other Findings ?Past Medical History: ?No date: Eczema ?No date: Epistaxis ?No date: Essential hypertension ?No date: Prediabetes ? ? Reproductive/Obstetrics ?negative OB ROS ? ?  ? ? ? ? ? ? ? ? ? ? ? ? ? ?  ?  ? ? ? ? ? ? ? ? ?Anesthesia Physical ?Anesthesia Plan ? ?ASA: 2 ? ?Anesthesia Plan: General  ? ?Post-op Pain Management:   ? ?Induction:  ? ?PONV Risk Score and Plan: 2 and Propofol infusion and TIVA ? ?Airway Management Planned:  ? ?Additional Equipment:  ? ?Intra-op Plan:  ? ?Post-operative Plan:  ? ?Informed Consent: I have reviewed the patients History and Physical, chart, labs and discussed the procedure including the risks, benefits and alternatives for the proposed anesthesia with the patient or authorized  representative who has indicated his/her understanding and acceptance.  ? ? ? ?Dental Advisory Given ? ?Plan Discussed with: Anesthesiologist, CRNA and Surgeon ? ?Anesthesia Plan Comments:   ? ? ? ? ? ? ?Anesthesia Quick Evaluation ? ?

## 2021-10-21 NOTE — Transfer of Care (Signed)
Immediate Anesthesia Transfer of Care Note ? ?Patient: Joseph Howe ? ?Procedure(s) Performed: COLONOSCOPY WITH PROPOFOL ? ?Patient Location: PACU ? ?Anesthesia Type:General ? ?Level of Consciousness: awake and alert  ? ?Airway & Oxygen Therapy: Patient Spontanous Breathing and Patient connected to nasal cannula oxygen ? ?Post-op Assessment: Report given to RN and Post -op Vital signs reviewed and stable ? ?Post vital signs: Reviewed and stable ? ?Last Vitals:  ?Vitals Value Taken Time  ?BP 105/74 10/21/21 0939  ?Temp    ?Pulse 98 10/21/21 0940  ?Resp 24 10/21/21 0940  ?SpO2 94 % 10/21/21 0940  ?Vitals shown include unvalidated device data. ? ?Last Pain:  ?Vitals:  ? 10/21/21 0938  ?TempSrc:   ?PainSc: 0-No pain  ?   ? ?  ? ?Complications: No notable events documented. ?

## 2021-10-21 NOTE — Op Note (Signed)
[facility] ?Gastroenterology ?Patient Name: Joseph Howe ?Procedure Date: 10/21/2021 9:07 AM ?MRN: [id number] ?Account #: [id number] ?Date of Birth: [birth year] ?Admit Type: Outpatient ?Age: 61 ?Room: [facility] ENDO ROOM 3 ?Gender: Male ?Note Status: Finalized ?Instrument Name: Colonoscope [id number] ?Procedure:             Colonoscopy ?Indications:           Screening for colorectal malignant neoplasm ?Providers:             Jonathon Bellows MD, MD ?Referring MD:          Pleas Koch (Referring MD) ?Medicines:             Monitored Anesthesia Care ?Complications:         No immediate complications. ?Procedure:             Pre-Anesthesia Assessment: ?                       - Prior to the procedure, a History and Physical was  ?                       performed, and patient medications, allergies and  ?                       sensitivities were reviewed. The patient's tolerance  ?                       of previous anesthesia was reviewed. ?                       - The risks and benefits of the procedure and the  ?                       sedation options and risks were discussed with the  ?                       patient. All questions were answered and informed  ?                       consent was obtained. ?                       - ASA Grade Assessment: II - A patient with mild  ?                       systemic disease. ?                       After obtaining informed consent, the colonoscope was  ?                       passed under direct vision. Throughout the procedure,  ?                       the patient's blood pressure, pulse, and oxygen  ?                       saturations were monitored continuously. The  ?                       Colonoscope was introduced  through the anus and  ?                       advanced to the the cecum, identified by the  ?                       appendiceal orifice. The colonoscopy was performed  ?                       with ease. The patient tolerated the procedure well.  ?                        The quality of the bowel preparation was good. ?Findings: ?     The perianal and digital rectal examinations were normal. ?     A 5 mm polyp was found in the rectum. The polyp was sessile. The polyp  ?     was removed with a cold snare. Resection and retrieval were complete. ?     Two sessile polyps were found in the sigmoid colon. The polyps were 5 to  ?     6 mm in size. These polyps were removed with a cold snare. Resection and  ?     retrieval were complete. ?     A 15 mm polyp was found in the descending colon. The polyp was  ?     pedunculated. The polyp was removed with a hot snare. Resection and  ?     retrieval were complete. To prevent bleeding after the polypectomy, one  ?     hemostatic clip was successfully placed. There was no bleeding at the  ?     end of the procedure. ?     Non-bleeding internal hemorrhoids were found during retroflexion. The  ?     hemorrhoids were large and Grade I (internal hemorrhoids that do not  ?     prolapse). ?     The exam was otherwise without abnormality on direct and retroflexion  ?     views. ?Impression:            - One 5 mm polyp in the rectum, removed with a cold  ?                       snare. Resected and retrieved. ?                       - Two 5 to 6 mm polyps in the sigmoid colon, removed  ?                       with a cold snare. Resected and retrieved. ?                       - One 15 mm polyp in the descending colon, removed  ?                       with a hot snare. Resected and retrieved. Clip was  ?                       placed. ?                       -  Non-bleeding internal hemorrhoids. ?                       - The examination was otherwise normal on direct and  ?                       retroflexion views. ?Recommendation:        - Discharge patient to home (with escort). ?                       - Resume previous diet. ?                       - Continue present medications. ?                       - Await pathology results. ?                        - Repeat colonoscopy in 3 years for surveillance based  ?                       on pathology results. ?Procedure Code(s):     --- Professional --- ?                       [phone], Colonoscopy, flexible; with removal of  ?                       tumor(s), polyp(s), or other lesion(s) by snare  ?                       technique ?Diagnosis Code(s):     --- Professional --- ?                       Z12.11, Encounter for screening for malignant neoplasm  ?                       of colon ?                       K63.5, Polyp of colon ?                       K62.1, Rectal polyp ?                       K64.0, First degree hemorrhoids ?CPT copyright 2019 American Medical Association. All rights reserved. ?The codes documented in this report are preliminary and upon coder review may  ?be revised to meet current compliance requirements. ?Jonathon Bellows, MD ?Jonathon Bellows MD, MD ?10/21/2021 9:38:19 AM ?This report has been signed electronically. ?Number of Addenda: 0 ?Note Initiated On: 10/21/2021 9:07 AM ?Scope Withdrawal Time: 0 hours 15 minutes 42 seconds  ?Total Procedure Duration: 0 hours 20 minutes 46 seconds  ?Estimated Blood Loss:  Estimated blood loss: none. ?     [facility] ?

## 2021-10-21 NOTE — Anesthesia Postprocedure Evaluation (Signed)
Anesthesia Post Note ? ?Patient: Tollie Canada ? ?Procedure(s) Performed: COLONOSCOPY WITH PROPOFOL ? ?Patient location during evaluation: Endoscopy ?Anesthesia Type: General ?Level of consciousness: awake and alert ?Pain management: pain level controlled ?Vital Signs Assessment: post-procedure vital signs reviewed and stable ?Respiratory status: spontaneous breathing, nonlabored ventilation, respiratory function stable and patient connected to nasal cannula oxygen ?Cardiovascular status: blood pressure returned to baseline and stable ?Postop Assessment: no apparent nausea or vomiting ?Anesthetic complications: no ? ? ?No notable events documented. ? ? ?Last Vitals:  ?Vitals:  ? 10/21/21 0948 10/21/21 1008  ?BP: 137/81 (!) 143/94  ?Pulse:    ?Resp:    ?Temp: 36.7 ?C   ?SpO2:    ?  ?Last Pain:  ?Vitals:  ? 10/21/21 1000  ?TempSrc:   ?PainSc: 0-No pain  ? ? ?  ?  ?  ?  ?  ?  ? ?Martha Clan ? ? ? ? ?

## 2021-10-22 ENCOUNTER — Encounter: Payer: Self-pay | Admitting: Gastroenterology

## 2021-10-22 LAB — SURGICAL PATHOLOGY

## 2021-11-16 ENCOUNTER — Telehealth: Payer: Self-pay | Admitting: Primary Care

## 2021-11-16 NOTE — Telephone Encounter (Signed)
Josh L. ?[facility] ? ?Lab claim for 9.19.22 billed with single diagnosis code z12.5 fine for PSA measurement, deductible applied to visit ? ?Wellness check on 9.20.22 code used to be more inclusive ? ?[phone] ?Ext. [phone] ? ?Submit corrected claim #[id number] ?Clinicals required from the visit to approve code change ? ?Fax: [phone] Attn: Josh L. ?

## 2021-11-18 ENCOUNTER — Ambulatory Visit: Payer: Commercial Managed Care - PPO | Admitting: Primary Care

## 2021-12-02 ENCOUNTER — Ambulatory Visit (INDEPENDENT_AMBULATORY_CARE_PROVIDER_SITE_OTHER): Payer: Commercial Managed Care - PPO | Admitting: Primary Care

## 2021-12-02 ENCOUNTER — Encounter: Payer: Self-pay | Admitting: Primary Care

## 2021-12-02 VITALS — BP 134/86 | HR 79 | Temp 97.6°F | Ht 70.0 in | Wt 269.0 lb

## 2021-12-02 DIAGNOSIS — E119 Type 2 diabetes mellitus without complications: Secondary | ICD-10-CM

## 2021-12-02 LAB — POCT GLYCOSYLATED HEMOGLOBIN (HGB A1C): Hemoglobin A1C: 5.6 % (ref 4.0–5.6)

## 2021-12-02 NOTE — Patient Instructions (Signed)
Keep working on your diet! ? ?Limit carbs, sweets, processed foods. ? ?Please schedule your physical for 6 months. ? ?It was a pleasure to see you today! ? ? ?

## 2021-12-02 NOTE — Assessment & Plan Note (Signed)
Controlled with A1C today of 5.6!! ? ?Commended him on dietary changes!  Encouraged regular exercise and to continue working on diet. ? ?Foot exam today. ?Managed on ARB. ? ?Follow-up in 6 months ?Continue without medication. ?

## 2021-12-02 NOTE — Progress Notes (Signed)
? ?Subjective:  ? ? Patient ID: Joseph Howe, male    DOB: 16-Sep-1960, 61 y.o.   MRN: [id number] ? ?HPI ? ?[name] is a very pleasant 61 y.o. male with a history of hypertension, type 2 diabetes, tobacco abuse, hyperlipidemia who presents today for follow-up of diabetes. ? ?Current medications include: None. During last visit patient opted to work on weight loss and lifestyle changes.  ? ?He is checking his blood glucose 0 times daily.  ? ?Last A1C: 6.6 in September 2022, 5.6 today! ?Last Eye Exam: Due ?Last Foot Exam: Due ?Pneumonia Vaccination:  ?Urine Microalbumin: Managed on ARB ?Statin: None ? ?Dietary changes since last visit: He's been eating non processed food. He does have carb heavy days. Drinking plenty of water.  ? ? ?Exercise: No exercise. Sedentary at work.  ? ?BP Readings from Last 3 Encounters:  ?12/02/21 134/86  ?10/21/21 (!) 143/94  ?05/04/21 (!) 146/82  ? ?Wt Readings from Last 3 Encounters:  ?12/02/21 269 lb (122 kg)  ?10/21/21 250 lb (113.4 kg)  ?05/04/21 269 lb (122 kg)  ? ? ? ? ? ?Review of Systems  ?Eyes:  Negative for visual disturbance.  ?Respiratory:  Negative for shortness of breath.   ?Cardiovascular:  Negative for chest pain.  ?Skin:  Positive for rash.  ?Neurological:  Negative for dizziness and numbness.  ? ?   ? ? ?Past Medical History:  ?Diagnosis Date  ? Eczema   ? Epistaxis   ? Essential hypertension   ? Prediabetes   ? ? ?Social History  ? ?Socioeconomic History  ? Marital status: Married  ?  Spouse name: Not on file  ? Number of children: Not on file  ? Years of education: Not on file  ? Highest education level: Not on file  ?Occupational History  ? Not on file  ?Tobacco Use  ? Smoking status: Former  ? Smokeless tobacco: Never  ?Vaping Use  ? Vaping Use: Never used  ?Substance and Sexual Activity  ? Alcohol use: Yes  ?  Alcohol/week: 12.0 standard drinks  ?  Types: 12 Shots of liquor per week  ? Drug use: Not on file  ? Sexual activity: Not on file  ?Other Topics Concern  ?  Not on file  ?Social History Narrative  ? Not on file  ? ?Social Determinants of Health  ? ?Financial Resource Strain: Not on file  ?Food Insecurity: Not on file  ?Transportation Needs: Not on file  ?Physical Activity: Not on file  ?Stress: Not on file  ?Social Connections: Not on file  ?Intimate Partner Violence: Not on file  ? ? ?Past Surgical History:  ?Procedure Laterality Date  ? COLONOSCOPY WITH PROPOFOL N/A 10/21/2021  ? Procedure: COLONOSCOPY WITH PROPOFOL;  Surgeon: Jonathon Bellows, MD;  Location: [facility] ENDOSCOPY;  Service: Gastroenterology;  Laterality: N/A;  ? TOTAL KNEE ARTHROPLASTY Left 07/20/2020  ? ? ?No family history on file. ? ?Allergies  ?Allergen Reactions  ? Pollen Extract   ? ? ?Current Outpatient Medications on File Prior to Visit  ?Medication Sig Dispense Refill  ? amLODipine (NORVASC) 5 MG tablet Take 1 tablet (5 mg total) by mouth daily. For blood pressure. 90 tablet 3  ? clotrimazole-betamethasone (LOTRISONE) cream APPLY TO AFFECTED AREA TWICE A DAY 45 g 1  ? losartan-hydrochlorothiazide (HYZAAR) 100-12.5 MG tablet Take 1 tablet by mouth daily. For blood pressure. 90 tablet 3  ? ?No current facility-administered medications on file prior to visit.  ? ? ?BP 134/86   Pulse  79   Temp 97.6 ?F (36.4 ?C) (Temporal)   Ht '5\' 10"'$  (1.778 m)   Wt 269 lb (122 kg)   SpO2 94%   BMI 38.60 kg/m?  ?Objective:  ? Physical Exam ?Cardiovascular:  ?   Rate and Rhythm: Normal rate and regular rhythm.  ?Pulmonary:  ?   Effort: Pulmonary effort is normal.  ?   Breath sounds: Normal breath sounds. No wheezing or rales.  ?Musculoskeletal:  ?   Cervical back: Neck supple.  ?Skin: ?   General: Skin is warm and dry.  ?   Comments: Plaque psoriasis to feet, lower extremities, upper extremities  ?Neurological:  ?   Mental Status: He is alert and oriented to person, place, and time.  ? ? ? ? ? ?   ?Assessment & Plan:  ? ? ? ? ?This visit occurred during the SARS-CoV-2 public health emergency.  Safety protocols were in  place, including screening questions prior to the visit, additional usage of staff PPE, and extensive cleaning of exam room while observing appropriate contact time as indicated for disinfecting solutions.  ?

## 2022-04-21 ENCOUNTER — Encounter: Payer: Commercial Managed Care - PPO | Admitting: Primary Care

## 2022-04-25 ENCOUNTER — Other Ambulatory Visit: Payer: Self-pay | Admitting: Primary Care

## 2022-04-25 DIAGNOSIS — I10 Essential (primary) hypertension: Secondary | ICD-10-CM

## 2022-05-06 ENCOUNTER — Ambulatory Visit (INDEPENDENT_AMBULATORY_CARE_PROVIDER_SITE_OTHER): Payer: Commercial Managed Care - PPO | Admitting: Primary Care

## 2022-05-06 ENCOUNTER — Encounter: Payer: Self-pay | Admitting: Primary Care

## 2022-05-06 VITALS — BP 156/96 | HR 75 | Temp 98.2°F | Ht 70.5 in | Wt 263.0 lb

## 2022-05-06 DIAGNOSIS — I10 Essential (primary) hypertension: Secondary | ICD-10-CM

## 2022-05-06 DIAGNOSIS — E785 Hyperlipidemia, unspecified: Secondary | ICD-10-CM | POA: Diagnosis not present

## 2022-05-06 DIAGNOSIS — L400 Psoriasis vulgaris: Secondary | ICD-10-CM

## 2022-05-06 DIAGNOSIS — E119 Type 2 diabetes mellitus without complications: Secondary | ICD-10-CM

## 2022-05-06 DIAGNOSIS — Z125 Encounter for screening for malignant neoplasm of prostate: Secondary | ICD-10-CM

## 2022-05-06 DIAGNOSIS — R3915 Urgency of urination: Secondary | ICD-10-CM | POA: Diagnosis not present

## 2022-05-06 DIAGNOSIS — Z0001 Encounter for general adult medical examination with abnormal findings: Secondary | ICD-10-CM | POA: Diagnosis not present

## 2022-05-06 LAB — LIPID PANEL
Cholesterol: 189 mg/dL (ref 0–200)
HDL: 68.4 mg/dL (ref >39.00)
LDL Cholesterol: 103 mg/dL — ABNORMAL HIGH (ref 0–99)
NonHDL: 120.39
Total CHOL/HDL Ratio: 3
Triglycerides: 87 mg/dL (ref 0.0–149.0)
VLDL: 17.4 mg/dL (ref 0.0–40.0)

## 2022-05-06 LAB — COMPREHENSIVE METABOLIC PANEL
ALT: 82 U/L — ABNORMAL HIGH (ref 0–53)
AST: 62 U/L — ABNORMAL HIGH (ref 0–37)
Albumin: 4.4 g/dL (ref 3.5–5.2)
Alkaline Phosphatase: 44 U/L (ref 39–117)
BUN: 11 mg/dL (ref 6–23)
CO2: 28 mEq/L (ref 19–32)
Calcium: 9.5 mg/dL (ref 8.4–10.5)
Chloride: 99 mEq/L (ref 96–112)
Creatinine, Ser: 0.75 mg/dL (ref 0.40–1.50)
GFR: 98.81 mL/min (ref >60.00)
Glucose, Bld: 69 mg/dL — ABNORMAL LOW (ref 70–99)
Potassium: 4.1 mEq/L (ref 3.5–5.1)
Sodium: 141 mEq/L (ref 135–145)
Total Bilirubin: 0.5 mg/dL (ref 0.2–1.2)
Total Protein: 8.1 g/dL (ref 6.0–8.3)

## 2022-05-06 LAB — PSA: PSA: 2.35 ng/mL (ref 0.10–4.00)

## 2022-05-06 MED ORDER — LOSARTAN POTASSIUM-HCTZ 100-25 MG PO TABS: 1.0000 | ORAL_TABLET | Freq: Every day | ORAL | 0 refills | Status: DC

## 2022-05-06 NOTE — Assessment & Plan Note (Signed)
Uncontrolled last year, declined statin therapy despite recommendations.  Repeat lipid panel pending.  Discuss to consider statin therapy for CVD risk.

## 2022-05-06 NOTE — Assessment & Plan Note (Signed)
Following with dermatology. Improving.  Continue Syrizi 150 mg every 12 weeks.

## 2022-05-06 NOTE — Patient Instructions (Signed)
Stop by the lab prior to leaving today. I will notify you of your results once received.   We increased your blood pressure medication:  Start losartan-hydrochlorothiazide to 100-25 from 100-12.5 mg. I sent a new prescription to your pharmacy.  Continue amlodipine 5 mg daily.  Please schedule a follow up visit to meet back with me in 2-3 weeks for blood pressure check.   It was a pleasure to see you today!  Preventive Care 1-59 Years Old, Male Preventive care refers to lifestyle choices and visits with your health care provider that can promote health and wellness. Preventive care visits are also called wellness exams. What can I expect for my preventive care visit? Counseling During your preventive care visit, your health care provider may ask about your: Medical history, including: Past medical problems. Family medical history. Current health, including: Emotional well-being. Home life and relationship well-being. Sexual activity. Lifestyle, including: Alcohol, nicotine or tobacco, and drug use. Access to firearms. Diet, exercise, and sleep habits. Safety issues such as seatbelt and bike helmet use. Sunscreen use. Work and work Statistician. Physical exam Your health care provider will check your: Height and weight. These may be used to calculate your BMI (body mass index). BMI is a measurement that tells if you are at a healthy weight. Waist circumference. This measures the distance around your waistline. This measurement also tells if you are at a healthy weight and may help predict your risk of certain diseases, such as type 2 diabetes and high blood pressure. Heart rate and blood pressure. Body temperature. Skin for abnormal spots. What immunizations do I need?  Vaccines are usually given at various ages, according to a schedule. Your health care provider will recommend vaccines for you based on your age, medical history, and lifestyle or other factors, such as travel or  where you work. What tests do I need? Screening Your health care provider may recommend screening tests for certain conditions. This may include: Lipid and cholesterol levels. Diabetes screening. This is done by checking your blood sugar (glucose) after you have not eaten for a while (fasting). Hepatitis B test. Hepatitis C test. HIV (human immunodeficiency virus) test. STI (sexually transmitted infection) testing, if you are at risk. Lung cancer screening. Prostate cancer screening. Colorectal cancer screening. Talk with your health care provider about your test results, treatment options, and if necessary, the need for more tests. Follow these instructions at home: Eating and drinking  Eat a diet that includes fresh fruits and vegetables, whole grains, lean protein, and low-fat dairy products. Take vitamin and mineral supplements as recommended by your health care provider. Do not drink alcohol if your health care provider tells you not to drink. If you drink alcohol: Limit how much you have to 0-2 drinks a day. Know how much alcohol is in your drink. In the [place] one drink equals one 12 oz bottle of beer (355 mL), one 5 oz glass of wine (148 mL), or one 1 oz glass of hard liquor (44 mL). Lifestyle Brush your teeth every morning and night with fluoride toothpaste. Floss one time each day. Exercise for at least 30 minutes 5 or more days each week. Do not use any products that contain nicotine or tobacco. These products include cigarettes, chewing tobacco, and vaping devices, such as e-cigarettes. If you need help quitting, ask your health care provider. Do not use drugs. If you are sexually active, practice safe sex. Use a condom or other form of protection to prevent STIs. Take  aspirin only as told by your health care provider. Make sure that you understand how much to take and what form to take. Work with your health care provider to find out whether it is safe and beneficial for you  to take aspirin daily. Find healthy ways to manage stress, such as: Meditation, yoga, or listening to music. Journaling. Talking to a trusted person. Spending time with friends and family. Minimize exposure to UV radiation to reduce your risk of skin cancer. Safety Always wear your seat belt while driving or riding in a vehicle. Do not drive: If you have been drinking alcohol. Do not ride with someone who has been drinking. When you are tired or distracted. While texting. If you have been using any mind-altering substances or drugs. Wear a helmet and other protective equipment during sports activities. If you have firearms in your house, make sure you follow all gun safety procedures. What's next? Go to your health care provider once a year for an annual wellness visit. Ask your health care provider how often you should have your eyes and teeth checked. Stay up to date on all vaccines. This information is not intended to replace advice given to you by your health care provider. Make sure you discuss any questions you have with your health care provider. Document Revised: 01/27/2021 Document Reviewed: 01/27/2021 Elsevier Patient Education  [employer].

## 2022-05-06 NOTE — Assessment & Plan Note (Signed)
Controlled in April 2023 with A1C of 5.6. Repeat A1C pending.   Discussed potential statin therapy. He will think about this. Continue off treatment for now.   Discussed the importance of a healthy diet and regular exercise in order for weight loss, and to reduce the risk of further co-morbidity. Declines pneumonia vaccine.  Await results.

## 2022-05-06 NOTE — Progress Notes (Signed)
Subjective:    Patient ID: Joseph Howe, male    DOB: February 24, 1961, 61 y.o.   MRN: [id number]  HPI  Joseph Howe is a very pleasant 61 y.o. male who presents today for complete physical and follow up of chronic conditions.  Immunizations: -Tetanus: 2018 -Influenza: Declines -Covid-19: 2 vaccines -Shingles: Completed Shingrix series  Diet: Fair diet. Is fasting more during the evening. Increased veggies and protein.  Exercise: No regular exercise.  Eye exam: Completes annually  Dental exam: Completes semi-annually   Colonoscopy: Completed in 2023, due 2026  PSA: Due  Wt Readings from Last 3 Encounters:  05/06/22 263 lb (119.3 kg)  12/02/21 269 lb (122 kg)  10/21/21 250 lb (113.4 kg)     BP Readings from Last 3 Encounters:  05/06/22 (!) 156/96  12/02/21 134/86  10/21/21 (!) 143/94   He is checking his BP at work and with donating blood and is seeing readings 120-140's/80's-90's.   Review of Systems  Constitutional:  Negative for unexpected weight change.  HENT:  Negative for rhinorrhea.   Eyes:  Negative for visual disturbance.  Respiratory:  Negative for cough and shortness of breath.   Cardiovascular:  Negative for chest pain.  Gastrointestinal:  Negative for constipation and diarrhea.  Genitourinary:  Negative for difficulty urinating.  Musculoskeletal:  Negative for arthralgias and myalgias.  Skin:  Negative for rash.  Allergic/Immunologic: Negative for environmental allergies.  Neurological:  Negative for dizziness and headaches.  Psychiatric/Behavioral:  The patient is not nervous/anxious.          Past Medical History:  Diagnosis Date   Eczema    Epistaxis    Essential hypertension    Prediabetes     Social History   Socioeconomic History   Marital status: Married    Spouse name: Not on file   Number of children: Not on file   Years of education: Not on file   Highest education level: Not on file  Occupational History   Not on file   Tobacco Use   Smoking status: Former   Smokeless tobacco: Never  Vaping Use   Vaping Use: Never used  Substance and Sexual Activity   Alcohol use: Yes    Alcohol/week: 12.0 standard drinks of alcohol    Types: 12 Shots of liquor per week   Drug use: Not on file   Sexual activity: Not on file  Other Topics Concern   Not on file  Social History Narrative   Not on file   Social Determinants of Health   Financial Resource Strain: Not on file  Food Insecurity: Not on file  Transportation Needs: Not on file  Physical Activity: Not on file  Stress: Not on file  Social Connections: Not on file  Intimate Partner Violence: Not on file    Past Surgical History:  Procedure Laterality Date   COLONOSCOPY WITH PROPOFOL N/A 10/21/2021   Procedure: COLONOSCOPY WITH PROPOFOL;  Surgeon: Jonathon Bellows, MD;  Location: [facility] ENDOSCOPY;  Service: Gastroenterology;  Laterality: N/A;   TOTAL KNEE ARTHROPLASTY Left 07/20/2020    History reviewed. No pertinent family history.  Allergies  Allergen Reactions   Pollen Extract     Current Outpatient Medications on File Prior to Visit  Medication Sig Dispense Refill   amLODipine (NORVASC) 5 MG tablet TAKE 1 TABLET BY MOUTH EVERY DAY FOR BLOOD PRESSURE 90 tablet 0   clotrimazole-betamethasone (LOTRISONE) cream APPLY TO AFFECTED AREA TWICE A DAY 45 g 1   Risankizumab-rzaa (SKYRIZI PEN) 150 MG/ML  SOAJ as directed Subcutaneous one pen week 12 after starter dose for 90 days     No current facility-administered medications on file prior to visit.    BP (!) 156/96   Pulse 75   Temp 98.2 F (36.8 C) (Temporal)   Ht 5' 10.5" (1.791 m)   Wt 263 lb (119.3 kg)   SpO2 95%   BMI 37.20 kg/m  Objective:   Physical Exam HENT:     Right Ear: Tympanic membrane and ear canal normal.     Left Ear: Tympanic membrane and ear canal normal.     Nose: Nose normal.     Right Sinus: No maxillary sinus tenderness or frontal sinus tenderness.     Left Sinus: No  maxillary sinus tenderness or frontal sinus tenderness.  Eyes:     Conjunctiva/sclera: Conjunctivae normal.  Neck:     Thyroid: No thyromegaly.     Vascular: No carotid bruit.  Cardiovascular:     Rate and Rhythm: Normal rate and regular rhythm.     Heart sounds: Normal heart sounds.  Pulmonary:     Effort: Pulmonary effort is normal.     Breath sounds: Normal breath sounds. No wheezing or rales.  Abdominal:     General: Bowel sounds are normal.     Palpations: Abdomen is soft.     Tenderness: There is no abdominal tenderness.  Musculoskeletal:        General: Normal range of motion.     Cervical back: Neck supple.  Skin:    General: Skin is warm and dry.  Neurological:     Mental Status: He is alert and oriented to person, place, and time.     Cranial Nerves: No cranial nerve deficit.     Deep Tendon Reflexes: Reflexes are normal and symmetric.  Psychiatric:        Mood and Affect: Mood normal.           Assessment & Plan:   Problem List Items Addressed This Visit       Cardiovascular and Mediastinum   Essential hypertension    Uncontrolled with home readings and office readings.  He has been working on dietary changes, commended him on this.  Increase losartan-HCTZ to 100-25 mg daily. Continue amlodipine 5 mg daily.  We will plan to see him back in 2-3 weeks for BP check.      Relevant Medications   losartan-hydrochlorothiazide (HYZAAR) 100-25 MG tablet     Endocrine   Controlled type 2 diabetes mellitus ([place])    Controlled in April 2023 with A1C of 5.6. Repeat A1C pending.   Discussed potential statin therapy. He will think about this. Continue off treatment for now.   Discussed the importance of a healthy diet and regular exercise in order for weight loss, and to reduce the risk of further co-morbidity. Declines pneumonia vaccine.  Await results.        Relevant Medications   losartan-hydrochlorothiazide (HYZAAR) 100-25 MG tablet      Musculoskeletal and Integument   Plaque psoriasis    Following with dermatology. Improving.  Continue Syrizi 150 mg every 12 weeks.         Other   Encounter for annual general medical examination with abnormal findings in adult - Primary    Declines influenza and pneumonia vaccines.  Discussed the importance of a healthy diet and regular exercise in order for weight loss, and to reduce the risk of further co-morbidity.  Exam stable. Labs pending.  Follow  up in 1 year for repeat physical.        Hyperlipidemia    Uncontrolled last year, declined statin therapy despite recommendations.  Repeat lipid panel pending.  Discuss to consider statin therapy for CVD risk.       Relevant Medications   losartan-hydrochlorothiazide (HYZAAR) 100-25 MG tablet   Other Relevant Orders   Lipid panel   Comprehensive metabolic panel   Urinary urgency    Improved with Saw Palmeto OTC. Continue to monitor.       Other Visit Diagnoses     Type 2 diabetes mellitus without complication, without long-term current use of insulin (HCC)       Relevant Medications   losartan-hydrochlorothiazide (HYZAAR) 100-25 MG tablet   Other Relevant Orders   Hemoglobin A1c   Screening for prostate cancer       Relevant Orders   PSA          Pleas Koch, NP

## 2022-05-06 NOTE — Assessment & Plan Note (Signed)
Uncontrolled with home readings and office readings.  He has been working on dietary changes, commended him on this.  Increase losartan-HCTZ to 100-25 mg daily. Continue amlodipine 5 mg daily.  We will plan to see him back in 2-3 weeks for BP check.

## 2022-05-06 NOTE — Addendum Note (Signed)
Addended by: Ellamae Sia on: 05/06/2022 08:23 AM   Modules accepted: Orders

## 2022-05-06 NOTE — Assessment & Plan Note (Signed)
Improved with Saw Palmeto OTC. Continue to monitor.

## 2022-05-06 NOTE — Assessment & Plan Note (Signed)
Declines influenza and pneumonia vaccines.  Discussed the importance of a healthy diet and regular exercise in order for weight loss, and to reduce the risk of further co-morbidity.  Exam stable. Labs pending.  Follow up in 1 year for repeat physical.

## 2022-05-07 LAB — HEMOGLOBIN A1C
Hgb A1c MFr Bld: 6.1 % of total Hgb — ABNORMAL HIGH (ref <5.7)
Mean Plasma Glucose: 128 mg/dL
eAG (mmol/L): 7.1 mmol/L

## 2022-05-12 ENCOUNTER — Telehealth: Payer: Self-pay

## 2022-05-12 NOTE — Telephone Encounter (Signed)
Informed patient physical exam form is complete and ready to be picked up.

## 2022-06-17 ENCOUNTER — Ambulatory Visit: Payer: Commercial Managed Care - PPO | Admitting: Primary Care

## 2022-07-01 ENCOUNTER — Encounter: Payer: Self-pay | Admitting: Primary Care

## 2022-07-01 ENCOUNTER — Ambulatory Visit (INDEPENDENT_AMBULATORY_CARE_PROVIDER_SITE_OTHER): Payer: Commercial Managed Care - PPO | Admitting: Primary Care

## 2022-07-01 ENCOUNTER — Encounter: Payer: Self-pay | Admitting: *Deleted

## 2022-07-01 VITALS — BP 128/84 | HR 95 | Temp 97.3°F | Ht 70.5 in | Wt 265.0 lb

## 2022-07-01 DIAGNOSIS — R7989 Other specified abnormal findings of blood chemistry: Secondary | ICD-10-CM | POA: Diagnosis not present

## 2022-07-01 DIAGNOSIS — I10 Essential (primary) hypertension: Secondary | ICD-10-CM

## 2022-07-01 LAB — BASIC METABOLIC PANEL
BUN: 15 mg/dL (ref 6–23)
CO2: 30 mEq/L (ref 19–32)
Calcium: 9.7 mg/dL (ref 8.4–10.5)
Chloride: 101 mEq/L (ref 96–112)
Creatinine, Ser: 0.78 mg/dL (ref 0.40–1.50)
GFR: 97.54 mL/min (ref >60.00)
Glucose, Bld: 100 mg/dL — ABNORMAL HIGH (ref 70–99)
Potassium: 4.2 mEq/L (ref 3.5–5.1)
Sodium: 141 mEq/L (ref 135–145)

## 2022-07-01 NOTE — Assessment & Plan Note (Signed)
Improved and controlled!  Continue losartan-HCTZ 100-25 mg daily. Continue amlodipine 5 mg daily.  BMP pending.

## 2022-07-01 NOTE — Patient Instructions (Signed)
Stop by the lab prior to leaving today. I will notify you of your results once received.   You will be contacted regarding your ultrasound.  Please let us know if you have not been contacted within two weeks.   It was a pleasure to see you today!   

## 2022-07-01 NOTE — Progress Notes (Signed)
Subjective:    Patient ID: Joseph Howe, male    DOB: 02/07/1961, 61 y.o.   MRN: [id number]  Hypertension Pertinent negatives include no chest pain, headaches or shortness of breath.    Joseph Howe is a very pleasant 61 y.o. male with a history of hypertension, prediabetes, hyperlipidemia who presents today for follow up of hypertension and elevated liver enzymes.   He was last evaluated on 05/06/22 for his CPE. Blood pressure was noted to be above goal, also with home readings, despite his current BP medication regimen. During this visit his losartan-HCTZ was increased to 100-25 mg, his amlodipine 5 mg was continued, and he was asked to return 2-3 weeks later.   He is here today (2 months later) for follow up. He is compliant to his losartan-HCTZ 100-25 mg daily and amlodipine 5 mg daily. He denies dizziness, headaches.   BP Readings from Last 3 Encounters:  07/01/22 128/84  05/06/22 (!) 156/96  12/02/21 134/86   Wt Readings from Last 3 Encounters:  07/01/22 265 lb (120.2 kg)  05/06/22 263 lb (119.3 kg)  12/02/21 269 lb (122 kg)   History of elevated LFT's over the last 1+ year. He admits to weigh gain. He does drink alcohol several times during the week. He does not routinely exercise. He's never undergone an ultrasound of the liver.    Review of Systems  Respiratory:  Negative for shortness of breath.   Cardiovascular:  Negative for chest pain.  Neurological:  Negative for dizziness and headaches.         Past Medical History:  Diagnosis Date   Eczema    Epistaxis    Essential hypertension    Prediabetes     Social History   Socioeconomic History   Marital status: Married    Spouse name: Not on file   Number of children: Not on file   Years of education: Not on file   Highest education level: Not on file  Occupational History   Not on file  Tobacco Use   Smoking status: Former   Smokeless tobacco: Never  Vaping Use   Vaping Use: Never used  Substance  and Sexual Activity   Alcohol use: Yes    Alcohol/week: 12.0 standard drinks of alcohol    Types: 12 Shots of liquor per week   Drug use: Not on file   Sexual activity: Not on file  Other Topics Concern   Not on file  Social History Narrative   Not on file   Social Determinants of Health   Financial Resource Strain: Not on file  Food Insecurity: Not on file  Transportation Needs: Not on file  Physical Activity: Not on file  Stress: Not on file  Social Connections: Not on file  Intimate Partner Violence: Not on file    Past Surgical History:  Procedure Laterality Date   COLONOSCOPY WITH PROPOFOL N/A 10/21/2021   Procedure: COLONOSCOPY WITH PROPOFOL;  Surgeon: Jonathon Bellows, MD;  Location: [facility] ENDOSCOPY;  Service: Gastroenterology;  Laterality: N/A;   TOTAL KNEE ARTHROPLASTY Left 07/20/2020    History reviewed. No pertinent family history.  Allergies  Allergen Reactions   Pollen Extract     Current Outpatient Medications on File Prior to Visit  Medication Sig Dispense Refill   amLODipine (NORVASC) 5 MG tablet TAKE 1 TABLET BY MOUTH EVERY DAY FOR BLOOD PRESSURE 90 tablet 0   losartan-hydrochlorothiazide (HYZAAR) 100-25 MG tablet Take 1 tablet by mouth daily. for blood pressure. 90 tablet 0  clotrimazole-betamethasone (LOTRISONE) cream APPLY TO AFFECTED AREA TWICE A DAY (Patient not taking: Reported on 07/01/2022) 45 g 1   No current facility-administered medications on file prior to visit.    BP 128/84   Pulse 95   Temp (!) 97.3 F (36.3 C) (Temporal)   Ht 5' 10.5" (1.791 m)   Wt 265 lb (120.2 kg)   SpO2 91%   BMI 37.49 kg/m  Objective:   Physical Exam Cardiovascular:     Rate and Rhythm: Normal rate and regular rhythm.  Pulmonary:     Effort: Pulmonary effort is normal.     Breath sounds: Normal breath sounds. No wheezing or rales.  Musculoskeletal:     Cervical back: Neck supple.  Skin:    General: Skin is warm and dry.  Neurological:     Mental Status:  He is alert and oriented to person, place, and time.           Assessment & Plan:   Problem List Items Addressed This Visit       Cardiovascular and Mediastinum   Essential hypertension - Primary    Improved and controlled!  Continue losartan-HCTZ 100-25 mg daily. Continue amlodipine 5 mg daily.  BMP pending.      Relevant Orders   Basic metabolic panel     Other   Elevated LFTs    Noted on prior labs.  Recommended [place] of liver. He agrees. RUQ abdominal US ordered and pending.   Recommended weight loss, alcohol consumption reduction.      Relevant Orders   US Abdomen Limited RUQ (LIVER/GB)       Pleas Koch, NP

## 2022-07-01 NOTE — Assessment & Plan Note (Signed)
Noted on prior labs.  Recommended [place] of liver. He agrees. RUQ abdominal US ordered and pending.   Recommended weight loss, alcohol consumption reduction.

## 2022-07-20 ENCOUNTER — Telehealth: Payer: Self-pay | Admitting: Primary Care

## 2022-07-20 NOTE — Telephone Encounter (Signed)
Patient was connected with [facility] imaging, he confirmed his appointment for [place] scheduled for tomorrow. He did not need anything further at this time.

## 2022-07-20 NOTE — Telephone Encounter (Signed)
Patient called in and stated that he is scheduled for an [place] tomorrow but hasn't heard anything. He had some questions regarding it. I did transfer him over to [place] but he would like a callback from someone. Thank you!

## 2022-07-21 ENCOUNTER — Ambulatory Visit
Admission: RE | Admit: 2022-07-21 | Discharge: 2022-07-21 | Disposition: A | Discharge status: Home / Self Care | Payer: Commercial Managed Care - PPO | Source: Ambulatory Visit | Attending: Primary Care | Admitting: Primary Care

## 2022-07-21 DIAGNOSIS — R7989 Other specified abnormal findings of blood chemistry: Secondary | ICD-10-CM

## 2022-08-01 ENCOUNTER — Other Ambulatory Visit: Payer: Self-pay | Admitting: Primary Care

## 2022-08-01 DIAGNOSIS — I10 Essential (primary) hypertension: Secondary | ICD-10-CM

## 2022-08-11 ENCOUNTER — Telehealth: Payer: Self-pay | Admitting: Primary Care

## 2022-08-11 NOTE — Telephone Encounter (Signed)
Patient called in and stated that [name] increased his dosage of losartan-hydrochlorothiazide (HYZAAR) 100-25 MG tablet. He was wanting to know if an updated prescription could be sent in to the pharmacy with this increase. Please advise. Thank you!

## 2022-08-11 NOTE — Telephone Encounter (Signed)
Called and notified patient that prescription for losartan-hydrochlorothiazide was sent in 08/01/22 90 tab with 2 refills.  He request refill from pharmacy.

## 2022-08-24 IMAGING — US US EXTREM LOW VENOUS*L*
1 series · 14 of 24 positions shown · non-contrast
Comparison: None.

CLINICAL DATA: Left leg swelling

EXAM:
LEFT LOWER EXTREMITY VENOUS DOPPLER ULTRASOUND
TECHNIQUE: Gray-scale sonography with compression, as well as color and duplex
ultrasound, were performed to evaluate the deep venous system(s)
from the level of the common femoral vein through the popliteal and
proximal calf veins.

[Series 1: us venous img lower uni left (dvt) · portal-venous · 14 of 39 slices shown]
[im 1/39]
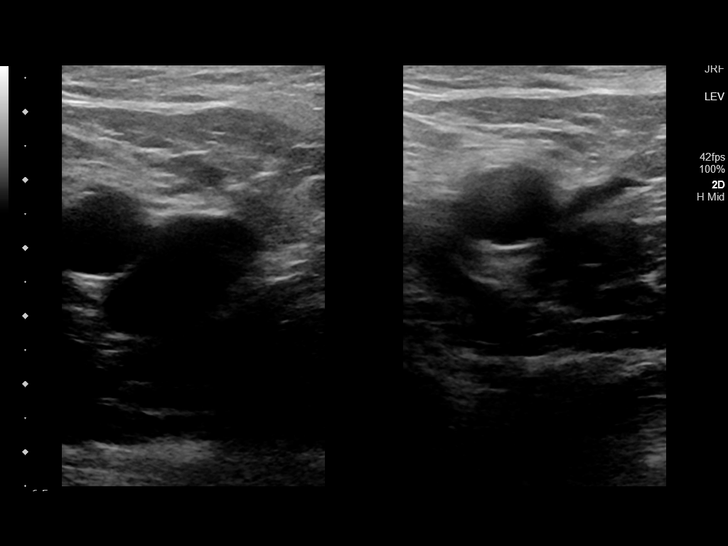
[im 4/39]
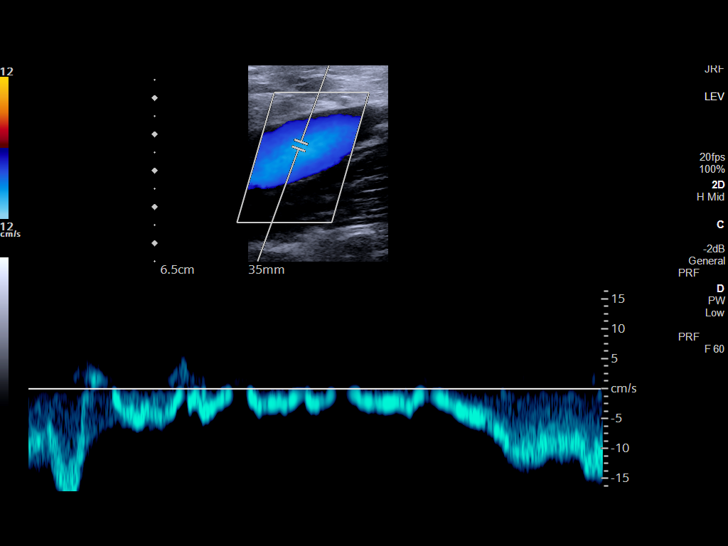
[im 7/39]
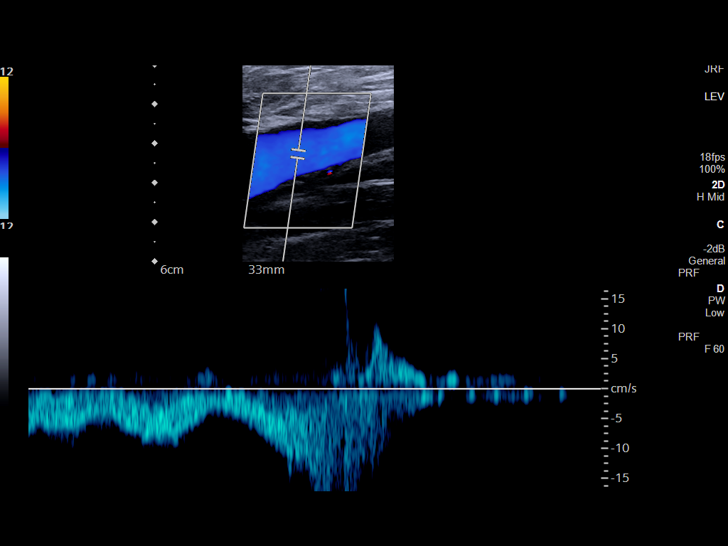
[im 10/39]
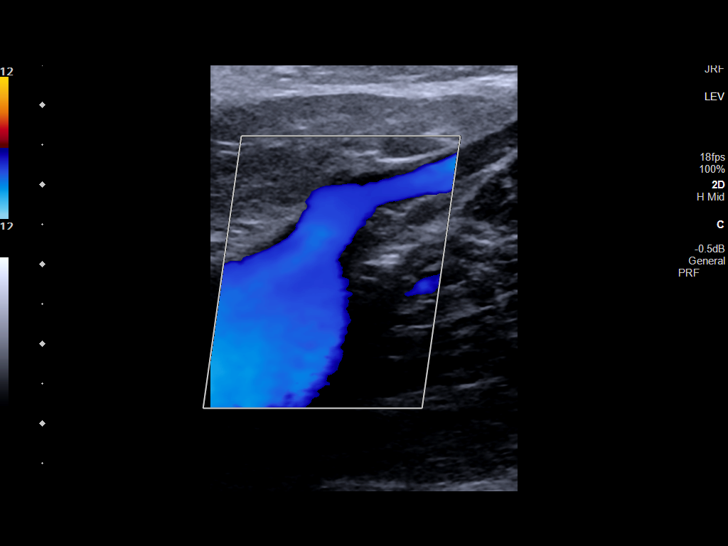
[im 12/39]
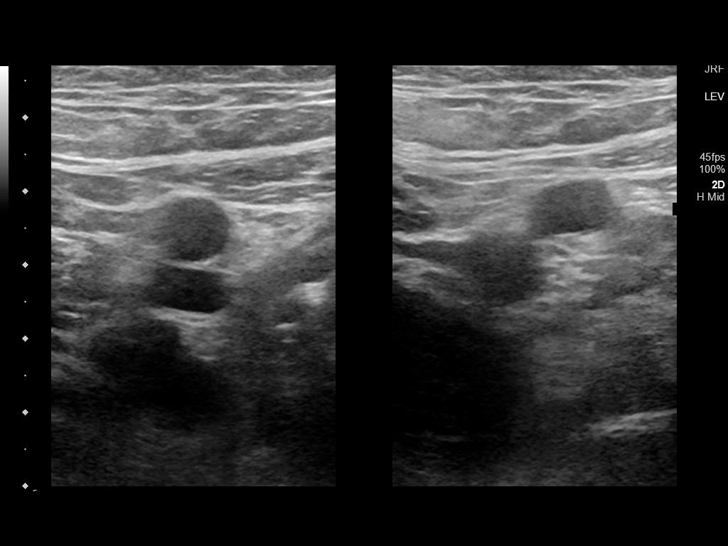
[im 15/39]
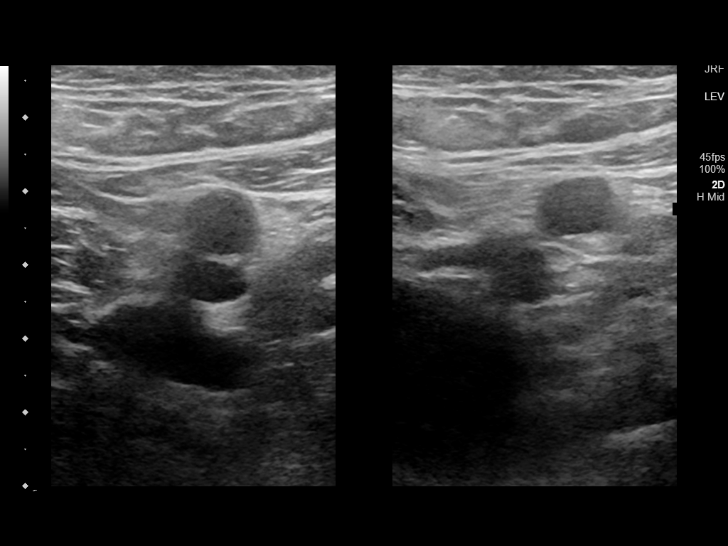
[im 19/39]
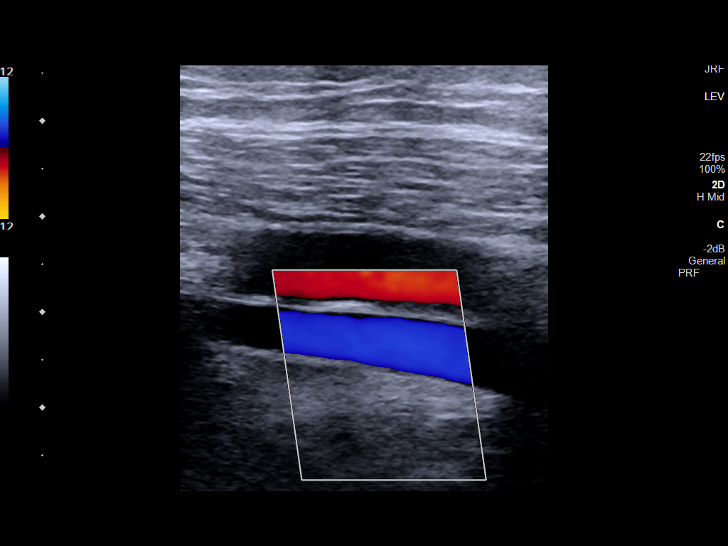
[im 20/39]
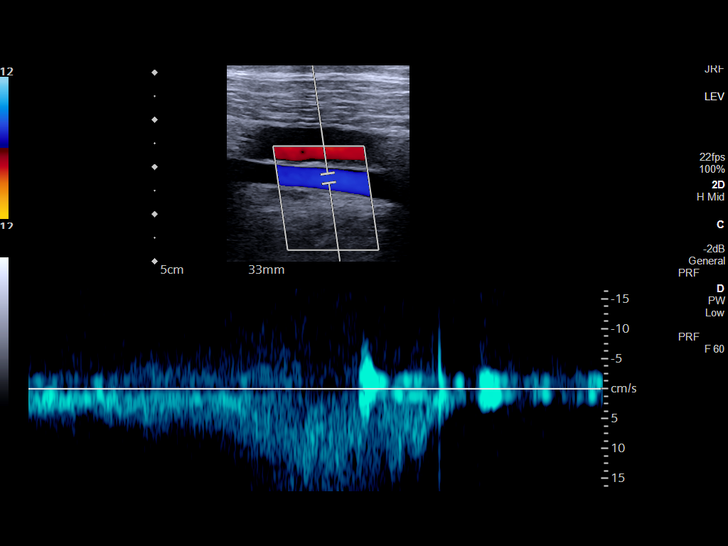
[im 24/39]
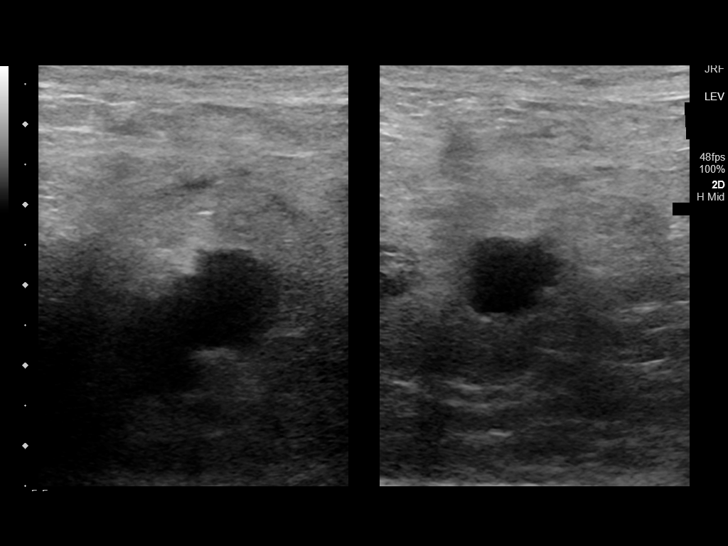
[im 27/39]
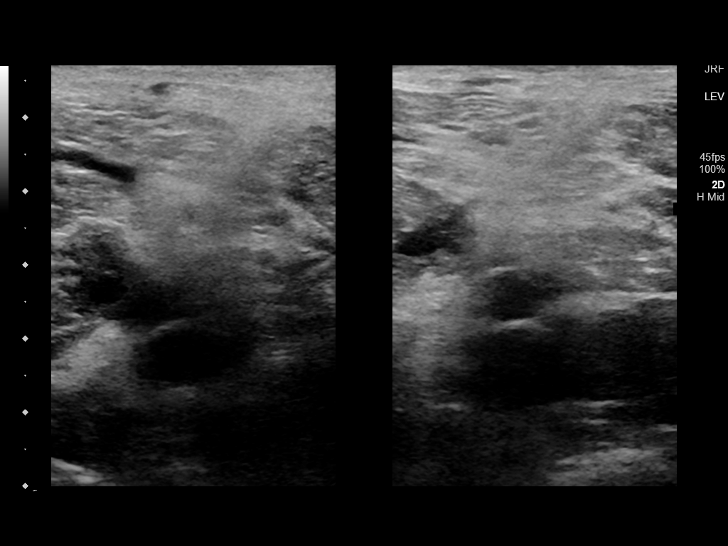
[im 30/39]
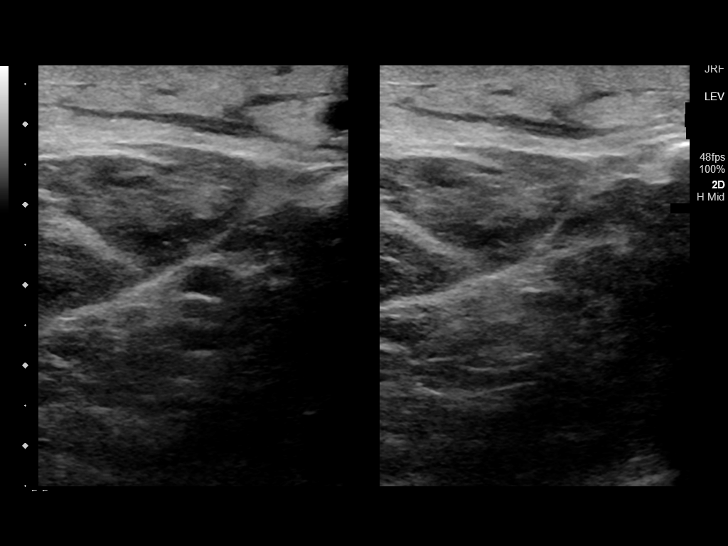
[im 32/39]
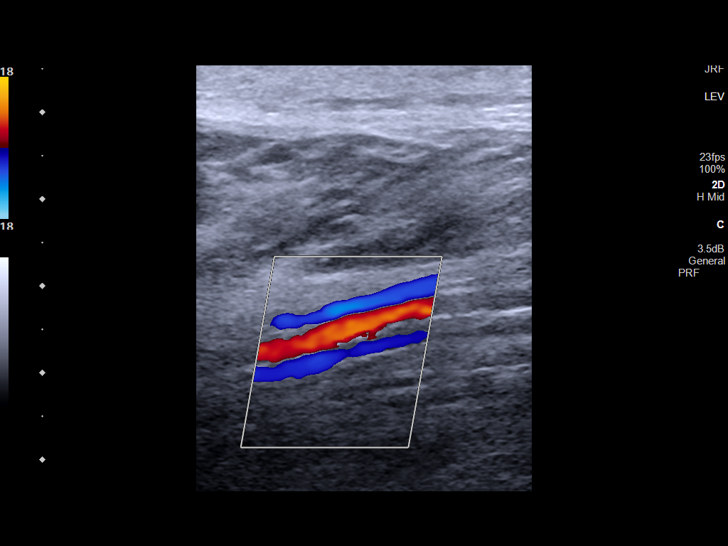
[im 35/39]
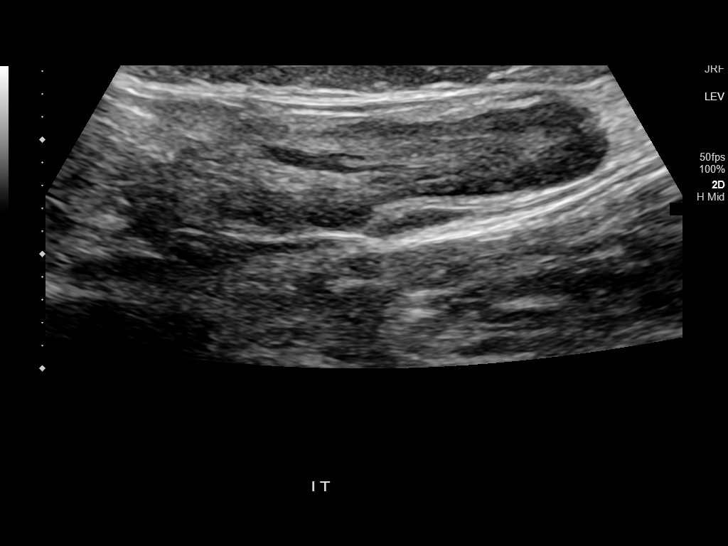
[im 39/39]
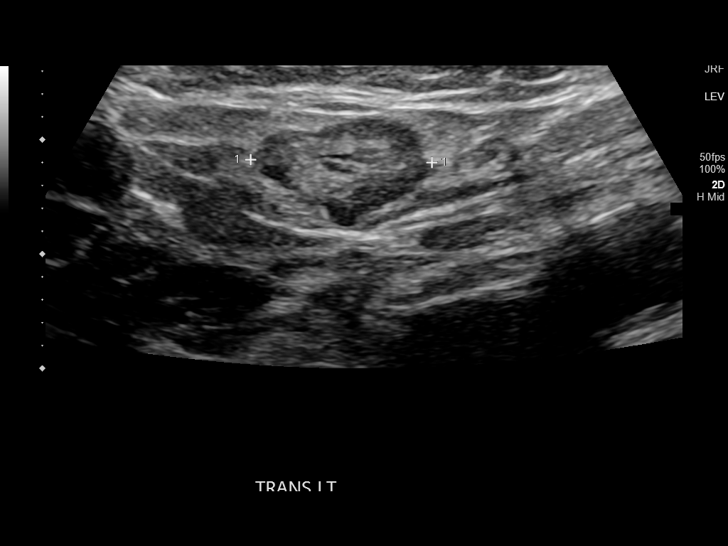

[14 of 24 positions shown; findings below may reference images not displayed]

FINDINGS: VENOUS

Normal compressibility of the common femoral, superficial femoral,
and popliteal veins, as well as the visualized calf veins.
Visualized portions of profunda femoral vein and great saphenous
vein unremarkable. No filling defects to suggest DVT on grayscale or
color Doppler imaging. Doppler waveforms show normal direction of
venous flow, normal respiratory plasticity and response to
augmentation.

Limited views of the contralateral common femoral vein are
unremarkable.

OTHER

None.

Limitations: none
IMPRESSION: No evidence of left lower extremity DVT.

## 2023-03-30 LAB — LAB REPORT - SCANNED: EGFR: 102

## 2023-04-21 ENCOUNTER — Other Ambulatory Visit: Payer: Self-pay | Admitting: Primary Care

## 2023-04-21 DIAGNOSIS — I10 Essential (primary) hypertension: Secondary | ICD-10-CM

## 2023-05-05 ENCOUNTER — Other Ambulatory Visit: Payer: Self-pay | Admitting: Primary Care

## 2023-05-05 DIAGNOSIS — I10 Essential (primary) hypertension: Secondary | ICD-10-CM

## 2023-05-07 IMAGING — US US EXTREM LOW VENOUS
1 series · 13 of 24 positions shown · non-contrast
Comparison: None.

CLINICAL DATA: New bilateral leg swelling for the past 2 weeks.



[Series 1: us venous img lower bilat (dvt) · portal-venous · 13 of 56 slices shown]
[im 1/56]
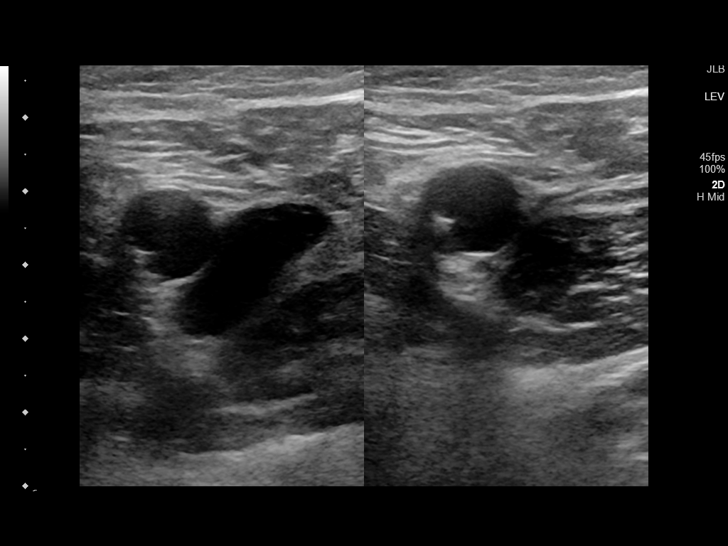
[im 5/56]
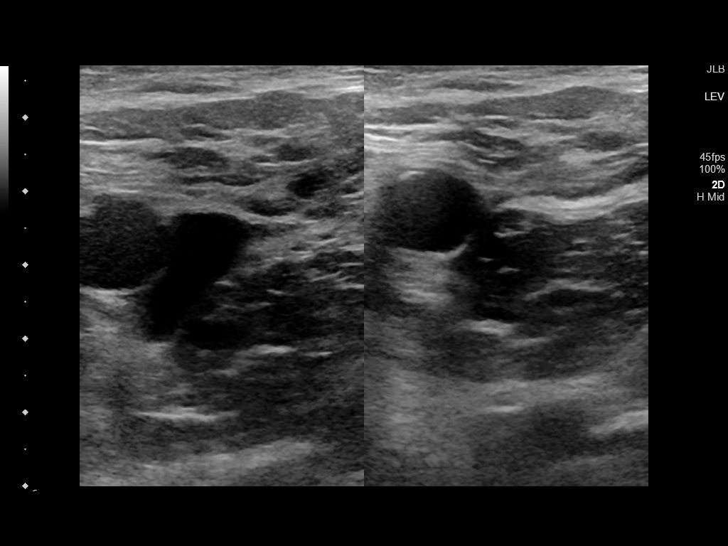
[im 10/56]
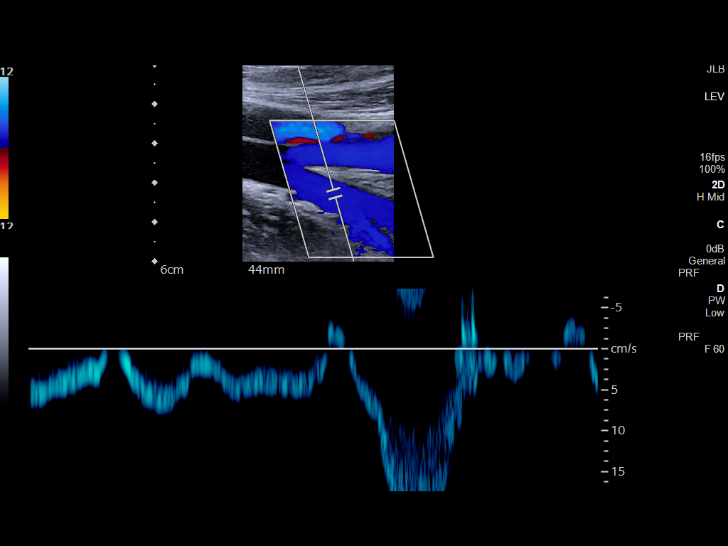
[im 15/56]
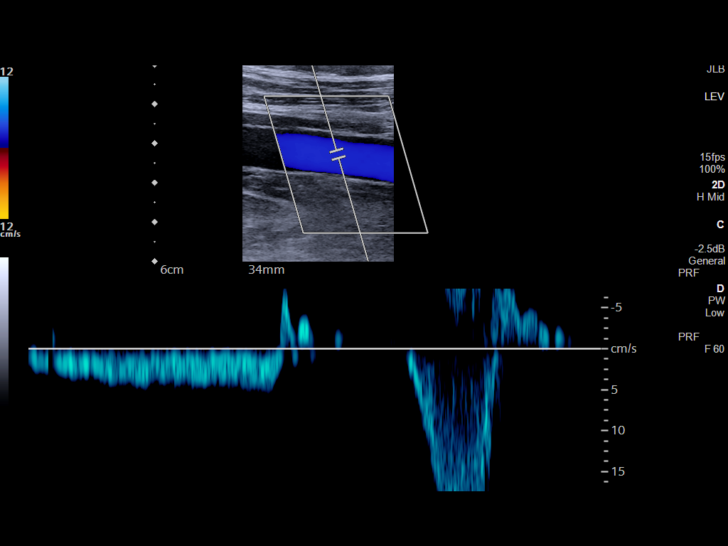
[im 20/56]
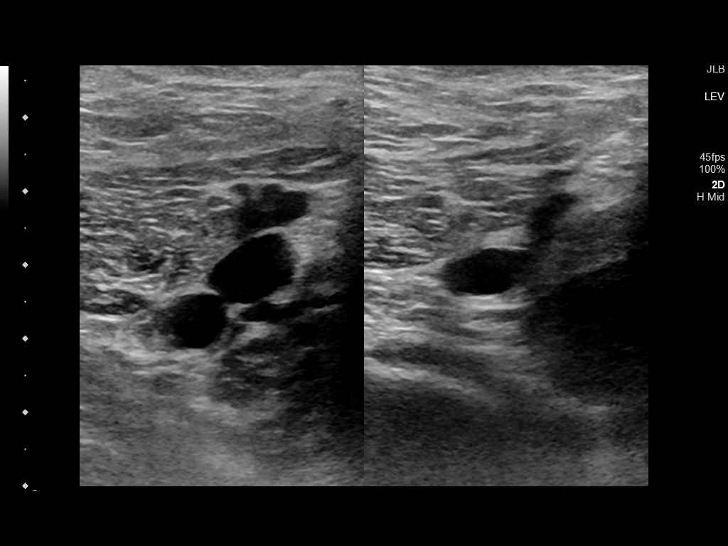
[im 24/56]
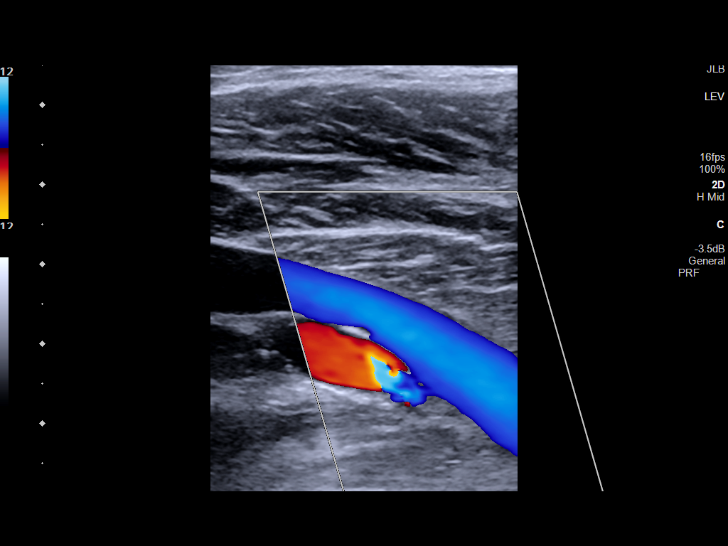
[im 29/56]
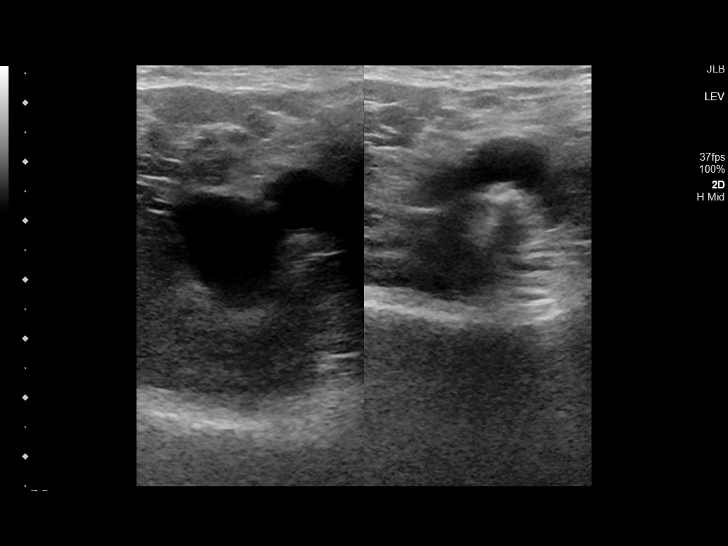
[im 32/56]
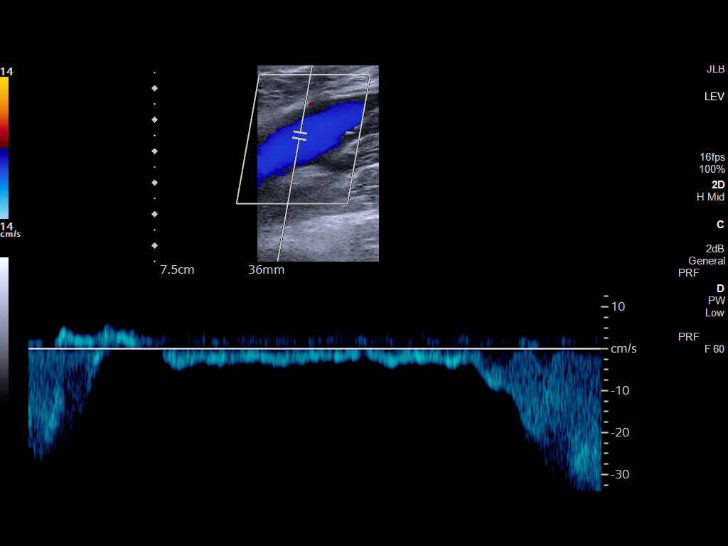
[im 36/56]
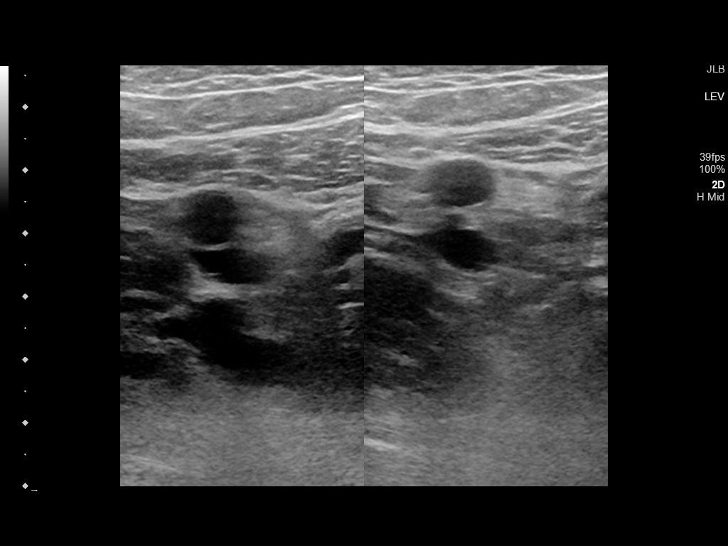
[im 41/56]
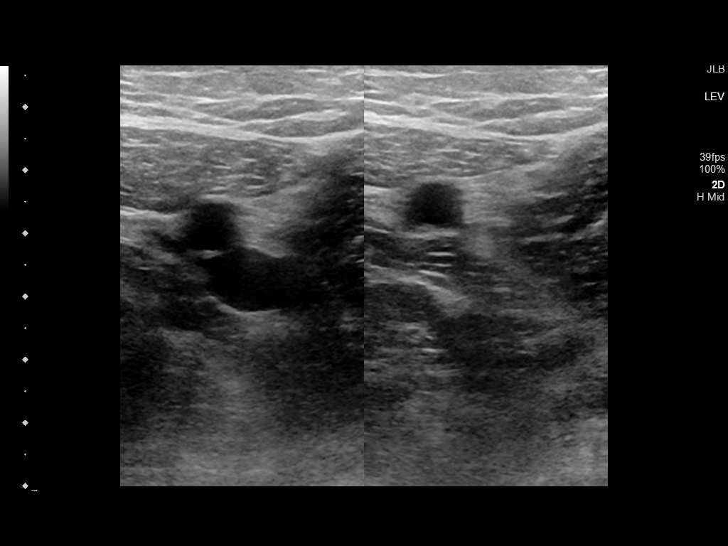
[im 46/56]
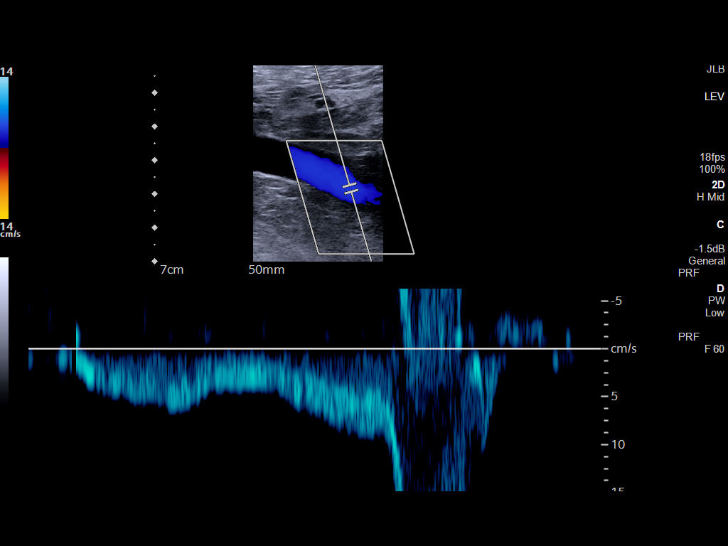
[im 51/56]
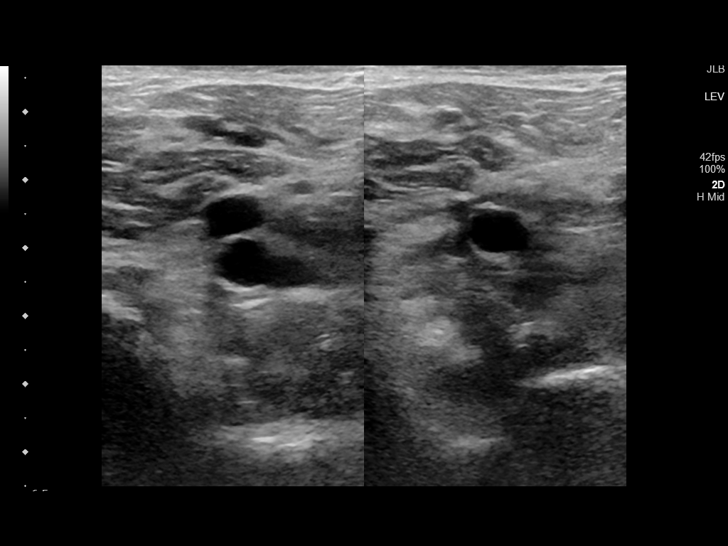
[im 56/56]
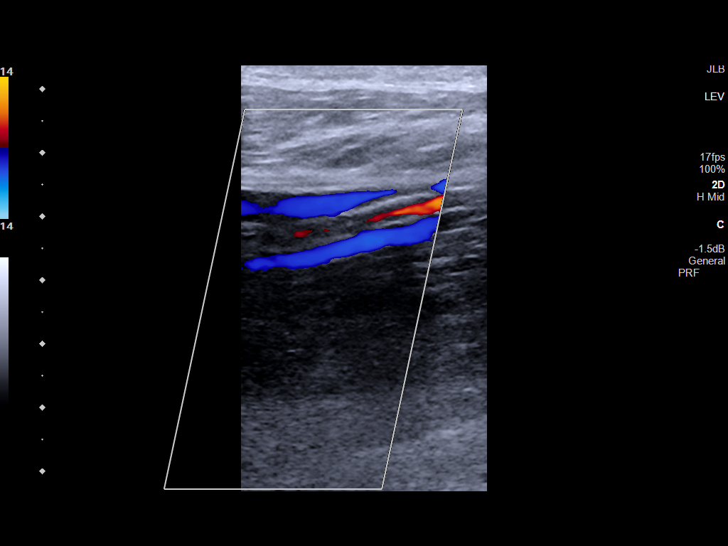

[13 of 24 positions shown; findings below may reference images not displayed]

FINDINGS: RIGHT LOWER EXTREMITY

Common Femoral Vein: No evidence of thrombus. Normal
compressibility, respiratory phasicity and response to augmentation.

Saphenofemoral Junction: No evidence of thrombus. Normal
compressibility and flow on color Doppler imaging.

Profunda Femoral Vein: No evidence of thrombus. Normal
compressibility and flow on color Doppler imaging.

Femoral Vein: No evidence of thrombus. Normal compressibility,
respiratory phasicity and response to augmentation.

Popliteal Vein: No evidence of thrombus. Normal compressibility,
respiratory phasicity and response to augmentation.

Calf Veins: No evidence of thrombus. Normal compressibility and flow
on color Doppler imaging.

Superficial Great Saphenous Vein: No evidence of thrombus. Normal
compressibility.

Venous Reflux:  None.

Other Findings:  None.

LEFT LOWER EXTREMITY

Common Femoral Vein: No evidence of thrombus. Normal
compressibility, respiratory phasicity and response to augmentation.

Saphenofemoral Junction: No evidence of thrombus. Normal
compressibility and flow on color Doppler imaging.

Profunda Femoral Vein: No evidence of thrombus. Normal
compressibility and flow on color Doppler imaging.

Femoral Vein: No evidence of thrombus. Normal compressibility,
respiratory phasicity and response to augmentation.

Popliteal Vein: No evidence of thrombus. Normal compressibility,
respiratory phasicity and response to augmentation.

Calf Veins: No evidence of thrombus. Normal compressibility and flow
on color Doppler imaging.

Superficial Great Saphenous Vein: No evidence of thrombus. Normal
compressibility.

Venous Reflux:  None.

Other Findings:  None.
IMPRESSION: No evidence of deep venous thrombosis in either lower extremity.

## 2023-05-10 ENCOUNTER — Ambulatory Visit (INDEPENDENT_AMBULATORY_CARE_PROVIDER_SITE_OTHER): Payer: Commercial Managed Care - PPO | Admitting: Primary Care

## 2023-05-10 ENCOUNTER — Encounter: Payer: Self-pay | Admitting: Primary Care

## 2023-05-10 VITALS — BP 134/86 | HR 65 | Temp 97.0°F | Ht 70.5 in | Wt 243.0 lb

## 2023-05-10 DIAGNOSIS — R3912 Poor urinary stream: Secondary | ICD-10-CM | POA: Diagnosis not present

## 2023-05-10 DIAGNOSIS — L400 Psoriasis vulgaris: Secondary | ICD-10-CM

## 2023-05-10 DIAGNOSIS — Z720 Tobacco use: Secondary | ICD-10-CM | POA: Diagnosis not present

## 2023-05-10 DIAGNOSIS — E1165 Type 2 diabetes mellitus with hyperglycemia: Secondary | ICD-10-CM

## 2023-05-10 DIAGNOSIS — E785 Hyperlipidemia, unspecified: Secondary | ICD-10-CM

## 2023-05-10 DIAGNOSIS — I10 Essential (primary) hypertension: Secondary | ICD-10-CM | POA: Diagnosis not present

## 2023-05-10 DIAGNOSIS — Z0001 Encounter for general adult medical examination with abnormal findings: Secondary | ICD-10-CM | POA: Diagnosis not present

## 2023-05-10 DIAGNOSIS — Z125 Encounter for screening for malignant neoplasm of prostate: Secondary | ICD-10-CM | POA: Diagnosis not present

## 2023-05-10 LAB — COMPREHENSIVE METABOLIC PANEL
ALT: 83 U/L — ABNORMAL HIGH (ref 0–53)
AST: 44 U/L — ABNORMAL HIGH (ref 0–37)
Albumin: 4.4 g/dL (ref 3.5–5.2)
Alkaline Phosphatase: 38 U/L — ABNORMAL LOW (ref 39–117)
BUN: 18 mg/dL (ref 6–23)
CO2: 30 mEq/L (ref 19–32)
Calcium: 9.8 mg/dL (ref 8.4–10.5)
Chloride: 99 mEq/L (ref 96–112)
Creatinine, Ser: 0.79 mg/dL (ref 0.40–1.50)
GFR: 96.59 mL/min (ref >60.00)
Glucose, Bld: 102 mg/dL — ABNORMAL HIGH (ref 70–99)
Potassium: 3.9 mEq/L (ref 3.5–5.1)
Sodium: 138 mEq/L (ref 135–145)
Total Bilirubin: 1.2 mg/dL (ref 0.2–1.2)
Total Protein: 7.7 g/dL (ref 6.0–8.3)

## 2023-05-10 LAB — LIPID PANEL
Cholesterol: 200 mg/dL (ref 0–200)
HDL: 85.6 mg/dL (ref >39.00)
LDL Cholesterol: 99 mg/dL (ref 0–99)
NonHDL: 114.24
Total CHOL/HDL Ratio: 2
Triglycerides: 78 mg/dL (ref 0.0–149.0)
VLDL: 15.6 mg/dL (ref 0.0–40.0)

## 2023-05-10 LAB — HEMOGLOBIN A1C: Hgb A1c MFr Bld: 6.1 % (ref 4.6–6.5)

## 2023-05-10 LAB — PSA: PSA: 3.58 ng/mL (ref 0.10–4.00)

## 2023-05-10 LAB — MICROALBUMIN / CREATININE URINE RATIO
Creatinine,U: 167.1 mg/dL
Microalb Creat Ratio: 0.8 mg/g (ref 0.0–30.0)
Microalb, Ur: 1.4 mg/dL (ref 0.0–1.9)

## 2023-05-10 MED ORDER — BUPROPION HCL ER (XL) 150 MG PO TB24: 150.0000 mg | ORAL_TABLET | Freq: Every day | ORAL | 0 refills | Status: AC

## 2023-05-10 MED ORDER — TAMSULOSIN HCL 0.4 MG PO CAPS
0.4000 mg | ORAL_CAPSULE | Freq: Every day | ORAL | 0 refills | Status: AC
Start: 2023-05-10 — End: ?

## 2023-05-10 NOTE — Progress Notes (Signed)
Subjective:    Patient ID: Joseph Howe, male    DOB: 1961-07-14, 62 y.o.   MRN: [id number]  HPI  Joseph Howe is a very pleasant 62 y.o. male who presents today for complete physical and follow up of chronic conditions.  He would also like to discuss urine flow. He's noticed medium to weak stream and urinary urgency which occurs daily. He gets up once nightly to urinate. He began an OTC product about 4 months ago which has helped but has not resolved symptoms.   Immunizations: -Tetanus: Completed in 2018 -Influenza: Declines influenza vaccine. -Shingles: Completed Shingrix series  Diet: Fair diet.  Exercise: No regular exercise.  Eye exam: Completed 2 years ago Dental exam: Completes semi-annually    Colonoscopy: Completed in 2023, due 2026 Lung Cancer Screening: He began smoking at the age of 51, stopped from age [age], resumed in 2017, quit in 2020, resumed in 2024. Smoking 3/4 PPD. Declines lung cancer screening program.  He would like to resume Wellbutrin for which he's been successful in quitting on Wellbutrin previously.   PSA: Due  BP Readings from Last 3 Encounters:  05/10/23 134/86  07/01/22 128/84  05/06/22 (!) 156/96     Review of Systems  Constitutional:  Negative for unexpected weight change.  HENT:  Negative for rhinorrhea.   Respiratory:  Negative for cough and shortness of breath.   Cardiovascular:  Negative for chest pain.  Gastrointestinal:  Negative for constipation and diarrhea.  Genitourinary:  Positive for difficulty urinating.  Musculoskeletal:  Negative for arthralgias and myalgias.  Skin:  Negative for rash.  Allergic/Immunologic: Negative for environmental allergies.  Neurological:  Negative for dizziness and headaches.  Psychiatric/Behavioral:  The patient is not nervous/anxious.          Past Medical History:  Diagnosis Date   Eczema    Epistaxis    Essential hypertension    Prediabetes     Social History   Socioeconomic  History   Marital status: Married    Spouse name: Not on file   Number of children: Not on file   Years of education: Not on file   Highest education level: Master's degree (e.g., MA, MS, MEng, MEd, MSW, MBA)  Occupational History   Not on file  Tobacco Use   Smoking status: Former   Smokeless tobacco: Never  Vaping Use   Vaping status: Never Used  Substance and Sexual Activity   Alcohol use: Yes    Alcohol/week: 12.0 standard drinks of alcohol    Types: 12 Shots of liquor per week   Drug use: Not on file   Sexual activity: Not on file  Other Topics Concern   Not on file  Social History Narrative   Not on file   Social Determinants of Health   Financial Resource Strain: Low Risk  (05/08/2023)   Overall Financial Resource Strain (CARDIA)    Difficulty of Paying Living Expenses: Not hard at all  Food Insecurity: No Food Insecurity (05/08/2023)   Hunger Vital Sign    Worried About Running Out of Food in the Last Year: Never true    Ran Out of Food in the Last Year: Never true  Transportation Needs: No Transportation Needs (05/08/2023)   PRAPARE - Administrator, Civil Service (Medical): No    Lack of Transportation (Non-Medical): No  Physical Activity: Insufficiently Active (05/08/2023)   Exercise Vital Sign    Days of Exercise per Week: 3 days    Minutes of Exercise  per Session: 30 min  Stress: Stress Concern Present (05/08/2023)   [employer] of Occupational Health - Occupational Stress Questionnaire    Feeling of Stress : To some extent  Social Connections: Socially Integrated (05/08/2023)   Social Connection and Isolation Panel [NHANES]    Frequency of Communication with Friends and Family: More than three times a week    Frequency of Social Gatherings with Friends and Family: Once a week    Attends Religious Services: More than 4 times per year    Active Member of Clubs or Organizations: Yes    Attends Banker Meetings: More than 4 times per  year    Marital Status: Married  Catering manager Violence: Not on file    Past Surgical History:  Procedure Laterality Date   COLONOSCOPY WITH PROPOFOL N/A 10/21/2021   Procedure: COLONOSCOPY WITH PROPOFOL;  Surgeon: Wyline Mood, MD;  Location: [facility] ENDOSCOPY;  Service: Gastroenterology;  Laterality: N/A;   TOTAL KNEE ARTHROPLASTY Left 07/20/2020    History reviewed. No pertinent family history.  Allergies  Allergen Reactions   Pollen Extract     Current Outpatient Medications on File Prior to Visit  Medication Sig Dispense Refill   amLODipine (NORVASC) 5 MG tablet TAKE 1 TABLET BY MOUTH EVERY DAY FOR BLOOD PRESSURE 90 tablet 0   losartan-hydrochlorothiazide (HYZAAR) 100-25 MG tablet TAKE 1 TABLET BY MOUTH EVERY DAY FOR BLOOD PRESSURE 90 tablet 0   SKYRIZI PEN 150 MG/ML pen as directed Subcutaneous one pen week 12 after starter dose for 90 days     clotrimazole-betamethasone (LOTRISONE) cream APPLY TO AFFECTED AREA TWICE A DAY (Patient not taking: Reported on 07/01/2022) 45 g 1   No current facility-administered medications on file prior to visit.    BP 134/86   Pulse 65   Temp (!) 97 F (36.1 C) (Temporal)   Ht 5' 10.5" (1.791 m)   Wt 243 lb (110.2 kg)   SpO2 98%   BMI 34.37 kg/m  Objective:   Physical Exam HENT:     Right Ear: Tympanic membrane and ear canal normal.     Left Ear: Tympanic membrane and ear canal normal.     Nose: Nose normal.     Right Sinus: No maxillary sinus tenderness or frontal sinus tenderness.     Left Sinus: No maxillary sinus tenderness or frontal sinus tenderness.  Eyes:     Conjunctiva/sclera: Conjunctivae normal.  Neck:     Thyroid: No thyromegaly.     Vascular: No carotid bruit.  Cardiovascular:     Rate and Rhythm: Normal rate and regular rhythm.     Heart sounds: Normal heart sounds.  Pulmonary:     Effort: Pulmonary effort is normal.     Breath sounds: Normal breath sounds. No wheezing or rales.  Abdominal:     General: Bowel  sounds are normal.     Palpations: Abdomen is soft.     Tenderness: There is no abdominal tenderness.  Musculoskeletal:        General: Normal range of motion.     Cervical back: Neck supple.  Skin:    General: Skin is warm and dry.  Neurological:     Mental Status: He is alert and oriented to person, place, and time.     Cranial Nerves: No cranial nerve deficit.     Deep Tendon Reflexes: Reflexes are normal and symmetric.  Psychiatric:        Mood and Affect: Mood normal.  Assessment & Plan:  Encounter for annual general medical examination with abnormal findings in adult Assessment & Plan: Immunizations UTD. Declines influenza vaccine.  Colonoscopy UTD, due 2026 PSA due and pending. Declines lung cancer screening  Discussed the importance of a healthy diet and regular exercise in order for weight loss, and to reduce the risk of further co-morbidity.  Exam stable. Labs pending.  Follow up in 1 year for repeat physical.    Essential hypertension Assessment & Plan: Slightly higher, overall controlled.  Continue losartan-hydrochlorothiazide 100-25 mg daily, amlodipine 5 mg daily CMP pending.  Orders: -     Comprehensive metabolic panel  Controlled type 2 diabetes mellitus with hyperglycemia, without long-term current use of insulin (HCC) Assessment & Plan: Repeat A1C pending.  Remain off treatment for now. Urine microalbumin pending.  Orders: -     Hemoglobin A1c -     Microalbumin / creatinine urine ratio  Plaque psoriasis Assessment & Plan: Controlled.  Following with dermatology. Continue Skyrizi every 4 months.  Continue clotrimazole-betamethasone 1-0.05% cream PRN   Weak urinary stream Assessment & Plan: Discussed options. Start tamsulosin 0.4 mg dailly. He will update.   PSA pending.  Orders: -     Tamsulosin HCl; Take 1 capsule (0.4 mg total) by mouth daily. For urine flow  Dispense: 90 capsule; Refill: 0  Tobacco  abuse Assessment & Plan: Resumed, ready to quit. Declines lung cancer screening.  Resume Wellbutrin XL 150 mg daily as he was successful previously. New Rx sent to pharmacy.  He will update.  Orders: -     buPROPion HCl ER (XL); Take 1 tablet (150 mg total) by mouth daily. For smoking cessation  Dispense: 90 tablet; Refill: 0  Hyperlipidemia, unspecified hyperlipidemia type Assessment & Plan: Repeat lipid panel pending.  Discussed the importance of a healthy diet and regular exercise in order for weight loss, and to reduce the risk of further co-morbidity.   Orders: -     Lipid panel  Screening for prostate cancer -     PSA        Doreene Nest, NP

## 2023-05-10 NOTE — Assessment & Plan Note (Signed)
Repeat A1C pending.  Remain off treatment for now. Urine microalbumin pending.

## 2023-05-10 NOTE — Assessment & Plan Note (Addendum)
Slightly higher, overall controlled.  Continue losartan-hydrochlorothiazide 100-25 mg daily, amlodipine 5 mg daily CMP pending.

## 2023-05-10 NOTE — Patient Instructions (Signed)
Start tamsulosin (Flomax) 0.4 mg once daily for urine flow.  Start bupropion XL (Wellbutrin) 150 mg once daily for smoking cessation.  Stop by the lab prior to leaving today. I will notify you of your results once received.   It was a pleasure to see you today!

## 2023-05-10 NOTE — Assessment & Plan Note (Signed)
Repeat lipid panel pending.  Discussed the importance of a healthy diet and regular exercise in order for weight loss, and to reduce the risk of further co-morbidity.

## 2023-05-10 NOTE — Assessment & Plan Note (Addendum)
Immunizations UTD. Declines influenza vaccine.  Colonoscopy UTD, due 2026 PSA due and pending. Declines lung cancer screening  Discussed the importance of a healthy diet and regular exercise in order for weight loss, and to reduce the risk of further co-morbidity.  Exam stable. Labs pending.  Follow up in 1 year for repeat physical.

## 2023-05-10 NOTE — Assessment & Plan Note (Addendum)
Resumed, ready to quit. Declines lung cancer screening.  Resume Wellbutrin XL 150 mg daily as he was successful previously. New Rx sent to pharmacy.  He will update.

## 2023-05-10 NOTE — Assessment & Plan Note (Signed)
Discussed options. Start tamsulosin 0.4 mg dailly. He will update.   PSA pending.

## 2023-05-10 NOTE — Assessment & Plan Note (Signed)
Controlled.  Following with dermatology. Continue Skyrizi every 4 months.  Continue clotrimazole-betamethasone 1-0.05% cream PRN

## 2023-07-14 ENCOUNTER — Other Ambulatory Visit: Payer: Self-pay | Admitting: Primary Care

## 2023-07-14 DIAGNOSIS — I10 Essential (primary) hypertension: Secondary | ICD-10-CM

## 2023-07-30 ENCOUNTER — Other Ambulatory Visit: Payer: Self-pay | Admitting: Primary Care

## 2023-07-30 DIAGNOSIS — I10 Essential (primary) hypertension: Secondary | ICD-10-CM

## 2023-08-06 ENCOUNTER — Other Ambulatory Visit: Payer: Self-pay | Admitting: Primary Care

## 2023-08-06 DIAGNOSIS — Z720 Tobacco use: Secondary | ICD-10-CM

## 2023-08-06 DIAGNOSIS — R3912 Poor urinary stream: Secondary | ICD-10-CM

## 2023-08-07 NOTE — Telephone Encounter (Signed)
Unable to reach patient. Left voicemail to return call to our office.   

## 2023-08-07 NOTE — Telephone Encounter (Signed)
Please call patient:  Received refill requests for tamsulosin for urine flow/weak stream. Is this helping? Does he want a refll?  Also received refill request for bupropion for which we started to help with smoking. Did he quit smoking?
